# Patient Record
Sex: Female | Born: 1980 | Race: White | Hispanic: No | Marital: Single | Smoking: Current every day smoker
Health system: Southern US, Community
[De-identification: ages and names within clinical notes are randomized; demographics above are authoritative.]

## PROBLEM LIST (undated history)

## (undated) DIAGNOSIS — E079 Disorder of thyroid, unspecified: Secondary | ICD-10-CM

## (undated) DIAGNOSIS — I10 Essential (primary) hypertension: Secondary | ICD-10-CM

## (undated) DIAGNOSIS — G43909 Migraine, unspecified, not intractable, without status migrainosus: Secondary | ICD-10-CM

## (undated) DIAGNOSIS — Z8041 Family history of malignant neoplasm of ovary: Secondary | ICD-10-CM

## (undated) HISTORY — PX: MOUTH SURGERY: SHX715

## (undated) HISTORY — DX: Family history of malignant neoplasm of ovary: Z80.41

## (undated) HISTORY — PX: SHOULDER SURGERY: SHX246

## (undated) HISTORY — PX: SHOULDER ARTHROSCOPY: SHX128

---

## 2005-09-16 ENCOUNTER — Emergency Department: Payer: Self-pay | Admitting: Emergency Medicine

## 2007-05-31 ENCOUNTER — Emergency Department: Payer: Self-pay | Admitting: Emergency Medicine

## 2007-07-14 ENCOUNTER — Emergency Department: Payer: Self-pay | Admitting: Emergency Medicine

## 2007-10-11 ENCOUNTER — Ambulatory Visit: Payer: Self-pay | Admitting: Obstetrics & Gynecology

## 2007-10-16 ENCOUNTER — Ambulatory Visit: Payer: Self-pay | Admitting: Obstetrics & Gynecology

## 2007-11-07 ENCOUNTER — Observation Stay: Payer: Self-pay | Admitting: Obstetrics and Gynecology

## 2007-11-26 ENCOUNTER — Observation Stay: Payer: Self-pay | Admitting: Obstetrics and Gynecology

## 2007-12-09 ENCOUNTER — Inpatient Hospital Stay: Payer: Self-pay | Admitting: Obstetrics & Gynecology

## 2009-01-25 ENCOUNTER — Emergency Department: Payer: Self-pay | Admitting: Emergency Medicine

## 2009-01-28 ENCOUNTER — Emergency Department: Payer: Self-pay | Admitting: Emergency Medicine

## 2009-02-01 ENCOUNTER — Emergency Department: Payer: Self-pay | Admitting: Unknown Physician Specialty

## 2009-02-08 ENCOUNTER — Emergency Department: Payer: Self-pay | Admitting: Emergency Medicine

## 2009-02-15 ENCOUNTER — Emergency Department: Payer: Self-pay | Admitting: Unknown Physician Specialty

## 2010-06-11 ENCOUNTER — Emergency Department: Payer: Self-pay | Admitting: Emergency Medicine

## 2012-03-19 ENCOUNTER — Emergency Department: Payer: Self-pay | Admitting: Emergency Medicine

## 2012-05-03 ENCOUNTER — Emergency Department: Payer: Self-pay | Admitting: Emergency Medicine

## 2012-06-26 ENCOUNTER — Emergency Department: Payer: Self-pay | Admitting: Emergency Medicine

## 2014-07-20 ENCOUNTER — Encounter: Payer: Self-pay | Admitting: *Deleted

## 2014-07-20 ENCOUNTER — Emergency Department
Admission: EM | Admit: 2014-07-20 | Discharge: 2014-07-20 | Disposition: A | Discharge status: Home / Self Care | Payer: Self-pay | Attending: Emergency Medicine | Admitting: Emergency Medicine

## 2014-07-20 DIAGNOSIS — Z720 Tobacco use: Secondary | ICD-10-CM | POA: Insufficient documentation

## 2014-07-20 DIAGNOSIS — L0231 Cutaneous abscess of buttock: Secondary | ICD-10-CM | POA: Insufficient documentation

## 2014-07-20 MED ORDER — HYDROCODONE-ACETAMINOPHEN 5-325 MG PO TABS: 1.0000 | ORAL_TABLET | Freq: Four times a day (QID) | ORAL | Status: DC | PRN

## 2014-07-20 MED ORDER — DOXYCYCLINE HYCLATE 100 MG PO TABS: 100.0000 mg | ORAL_TABLET | Freq: Two times a day (BID) | ORAL | Status: DC

## 2014-07-20 NOTE — ED Provider Notes (Signed)
[facility] Provider Note ?____________________________________________ ? Time seen: 4:08 PM on 07/20/2014 ----------------------------------------- ? I have reviewed the triage vital signs and the nursing notes.  ________ HISTORY ? Chief Complaint Abscess   HPI  Howell Rucksara Simar is a 34 y.o. female with a complaint of tender, red, raised tender lesion to the right buttocks. She reports the boil has been draining today. She first noticed the tender, indurated area 2 days prior. She denies fever, chills, sweats.  History reviewed. No pertinent past medical history.  There are no active problems to display for this patient.  ? Past Surgical History  Procedure Laterality Date  . Cesarean section    . Shoulder surgery    . Mouth surgery    ? Allergies Tramadol ? No family history on file. ? Social History History  Substance Use Topics  . Smoking status: Current Every Day Smoker  . Smokeless tobacco: Not on file  . Alcohol Use: Yes    Review of Systems Constitutional: Negative for fever. HEENT:  Normocephalic/atraumatic. Negative for visual/hearingchanges, sore throat, or nasal congestion. Cardiovascular: Negative for chest pain. Respiratory: Negative for shortness of breath. Musculoskeletal: Negative for back pain. Skin: Positive for cellulitis & abscess Neurological: Negative for headaches, focal weakness or numbness. Hematological/Lymphatic:Negative for enlarged lymph nodes  10-point ROS otherwise negative.  ____________________________________________   PHYSICAL EXAM:  VITAL SIGNS: ED Triage Vitals  Enc Vitals Group     BP 07/20/14 1535 121/75 mmHg     Pulse Rate 07/20/14 1535 97     Resp 07/20/14 1535 18     Temp 07/20/14 1535 98.1 F (36.7 C)     Temp Source 07/20/14 1535 Oral     SpO2 07/20/14 1535 97 %     Weight 07/20/14 1535 217 lb (98.431 kg)     Height 07/20/14 1535 5\' 3"  (1.6 m)     Head Cir --       Peak Flow --      Pain Score 07/20/14 1536 8     Pain Loc --      Pain Edu? --      Excl. in GC? --     Constitutional: Alert and oriented. Well appearing and in no distress. HEENT: Normocephalic and atraumatic.Conjunctivae are normal. PERRL. Normal extraocular movements. Mucous membranes are moist. Hematological/Lymphatic/Immunilogical: No cervical lymphadenopathy. Cardiovascular: Normal rate, regular rhythm.No murmurs, rubs, or gallops.  Respiratory: Normal respiratory effort. Breath sounds are clear and equal bilaterally. No wheezes/rales/rhonchi. Gastrointestinal: Soft and nontender. No distention.  Genitourinary: Local cellulitis with underlying fluctuance noted to right buttock. Musculoskeletal: Nontender with normal range of motion in all extremities. No joint effusions.  No lower extremity tenderness nor edema. Neurologic:  Normal speech and language. No gross focal neurologic deficits are appreciated.  Skin:  Left buttocks with local erythema, induration, and small punctal lesion. A deep palpable cystic lesion is felt and thought to be appropriate for incision and drainage. Psychiatric: Mood and affect are normal. Speech and behavior are normal. Patient exhibits appropriate insight and judgment.  _____________ PROCEDURES ? Procedure(s) performed:  INCISION AND DRAINAGE Performed by: Sherrie SportElon PA-Student Supervised by Paulino DoorJenise Bacon Tristin Gladman, PA-C Consent: Verbal consent obtained. Risks and benefits: risks, benefits and alternatives were discussed Type: abscess  Body area: Right buttock  Anesthesia: local infiltration  Incision was made with a scalpel.  Local anesthetic: lidocaine 1% w/ epinephrine  Anesthetic total: 3 ml  Complexity: complex Blunt dissection to break up loculations  Drainage: purulent  Drainage amount:  small  Packing material: 1/4 in iodoform gauze  Patient tolerance: Patient tolerated the procedure well with no immediate  complications.   Critical Care performed: None  ______________________________________________________ INITIAL IMPRESSION / ASSESSMENT AND PLAN / ED COURSE ? Wound care instructions given. Patient discharged after procedure.  Pertinent labs & imaging results that were available during my care of the patient were reviewed by me and considered in my medical decision making (see chart for details).   ____________________________________________ FINAL CLINICAL IMPRESSION(S) / ED DIAGNOSES?  Final diagnoses:  Abscess of buttock       Lissa HoardJenise V Bacon Tyion Boylen, PA-C 07/20/14 1657  Darien Ramusavid W Kaminski, MD 07/20/14 2229

## 2014-07-20 NOTE — ED Notes (Addendum)
Pt reports she has a boil  on on right lower buttocks that has been draining today.

## 2014-07-20 NOTE — ED Notes (Signed)
NAD noted at time of D/C. Pt denies questions or concerns. Pt ambulatory to the lobby at this time.  

## 2014-07-20 NOTE — Discharge Instructions (Signed)
Abscess An abscess (boil or furuncle) is an infected area on or under the skin. This area is filled with yellowish-white fluid (pus) and other material (debris). HOME CARE   Only take medicines as told by your doctor.  If you were given antibiotic medicine, take it as directed. Finish the medicine even if you start to feel better.  If gauze is used, follow your doctor's directions for changing the gauze.  To avoid spreading the infection:  Keep your abscess covered with a bandage.  Wash your hands well.  Do not share personal care items, towels, or whirlpools with others.  Avoid skin contact with others.  Keep your skin and clothes clean around the abscess.  Keep all doctor visits as told. GET HELP RIGHT AWAY IF:   You have more pain, puffiness (swelling), or redness in the wound site.  You have more fluid or blood coming from the wound site.  You have muscle aches, chills, or you feel sick.  You have a fever. MAKE SURE YOU:   Understand these instructions.  Will watch your condition.  Will get help right away if you are not doing well or get worse. Document Released: 08/20/2007 Document Revised: 09/02/2011 Document Reviewed: 05/16/2011 [facility] Patient Information 2015 [place], [place]. This information is not intended to replace advice given to you by your health care provider. Make sure you discuss any questions you have with your health care provider.   Keep the wound clean, dry, and covered.  Apply warm compresses to promote healing.  Return the ED in 2-3 days for wound check as needed. Take the prescription meds as directed.

## 2014-07-20 NOTE — ED Notes (Signed)
States she developed an abcess to buttocks couple of days ago...large red raised area noted to right buttocks

## 2015-07-31 ENCOUNTER — Emergency Department
Admission: EM | Admit: 2015-07-31 | Discharge: 2015-07-31 | Disposition: A | Discharge status: Home / Self Care | Payer: Medicaid Other | Attending: Emergency Medicine | Admitting: Emergency Medicine

## 2015-07-31 ENCOUNTER — Encounter: Payer: Self-pay | Admitting: Emergency Medicine

## 2015-07-31 ENCOUNTER — Emergency Department: Payer: Medicaid Other

## 2015-07-31 DIAGNOSIS — R0981 Nasal congestion: Secondary | ICD-10-CM

## 2015-07-31 DIAGNOSIS — F17200 Nicotine dependence, unspecified, uncomplicated: Secondary | ICD-10-CM | POA: Diagnosis not present

## 2015-07-31 DIAGNOSIS — J0120 Acute ethmoidal sinusitis, unspecified: Secondary | ICD-10-CM | POA: Insufficient documentation

## 2015-07-31 DIAGNOSIS — J0100 Acute maxillary sinusitis, unspecified: Secondary | ICD-10-CM

## 2015-07-31 DIAGNOSIS — R42 Dizziness and giddiness: Secondary | ICD-10-CM

## 2015-07-31 LAB — URINALYSIS COMPLETE WITH MICROSCOPIC (ARMC ONLY)
Bilirubin Urine: NEGATIVE
Glucose, UA: NEGATIVE mg/dL
KETONES UR: NEGATIVE mg/dL
Leukocytes, UA: NEGATIVE
NITRITE: NEGATIVE
PH: 5 (ref 5.0–8.0)
Protein, ur: 30 mg/dL — AB
SPECIFIC GRAVITY, URINE: 1.015 (ref 1.005–1.030)

## 2015-07-31 LAB — BASIC METABOLIC PANEL
ANION GAP: 7 (ref 5–15)
BUN: 6 mg/dL (ref 6–20)
CALCIUM: 9 mg/dL (ref 8.9–10.3)
CO2: 26 mmol/L (ref 22–32)
CREATININE: 0.53 mg/dL (ref 0.44–1.00)
Chloride: 105 mmol/L (ref 101–111)
GFR calc Af Amer: >60 mL/min (ref >60)
Glucose, Bld: 108 mg/dL — ABNORMAL HIGH (ref 65–99)
Potassium: 3.6 mmol/L (ref 3.5–5.1)
Sodium: 138 mmol/L (ref 135–145)

## 2015-07-31 LAB — CBC
HCT: 38.9 % (ref 35.0–47.0)
HEMOGLOBIN: 13 g/dL (ref 12.0–16.0)
MCH: 28.3 pg (ref 26.0–34.0)
MCHC: 33.5 g/dL (ref 32.0–36.0)
MCV: 84.6 fL (ref 80.0–100.0)
PLATELETS: 208 10*3/uL (ref 150–440)
RBC: 4.6 MIL/uL (ref 3.80–5.20)
RDW: 15.2 % — ABNORMAL HIGH (ref 11.5–14.5)
WBC: 8.2 10*3/uL (ref 3.6–11.0)

## 2015-07-31 LAB — POCT PREGNANCY, URINE: Preg Test, Ur: NEGATIVE

## 2015-07-31 MED ORDER — DOXYCYCLINE HYCLATE 100 MG PO TABS
100.0000 mg | ORAL_TABLET | Freq: Once | ORAL | Status: AC
  Administered 2015-07-31: 100 mg via ORAL
  Filled 2015-07-31: qty 1

## 2015-07-31 MED ORDER — DOXYCYCLINE HYCLATE 50 MG PO CAPS: 100.0000 mg | ORAL_CAPSULE | Freq: Two times a day (BID) | ORAL | Status: DC

## 2015-07-31 NOTE — ED Notes (Signed)
Pt to ed with c/o intermittent dizziness x 2 days.  Also reports congestion in head and headache.  Pt also reports general joint pain and muscle pain and soreness.

## 2015-07-31 NOTE — ED Notes (Signed)
Patient given ginger ale. Awaiting provider re-eval, patient aware. No other needs at this time.

## 2015-07-31 NOTE — ED Notes (Signed)
Pt reports congestion and cough that began a few days ago, pt reports dizziness that began off and on 3 days ago.

## 2015-07-31 NOTE — Discharge Instructions (Signed)
You were evaluated for nasal congestion, headache, and dizziness, and your exam and evaluation are reassuring in the emergency room. You're being treated for sinusitis and vertigo. Make sure he stay well-hydrated. He may try over-the-counter Mucinex, and Sudafed. Return to emergency department for any worsening condition including infusion altered mental status, weakness or numbness, fever, or any other symptoms concerning to you.   Benign Positional Vertigo Vertigo is the feeling that you or your surroundings are moving when they are not. Benign positional vertigo is the most common form of vertigo. The cause of this condition is not serious (is benign). This condition is triggered by certain movements and positions (is positional). This condition can be dangerous if it occurs while you are doing something that could endanger you or others, such as driving.  CAUSES In many cases, the cause of this condition is not known. It may be caused by a disturbance in an area of the inner ear that helps your brain to sense movement and balance. This disturbance can be caused by a viral infection (labyrinthitis), head injury, or repetitive motion. RISK FACTORS This condition is more likely to develop in:  Women.  People who are 58 years of age or older. SYMPTOMS Symptoms of this condition usually happen when you move your head or your eyes in different directions. Symptoms may start suddenly, and they usually last for less than a minute. Symptoms may include:  Loss of balance and falling.  Feeling like you are spinning or moving.  Feeling like your surroundings are spinning or moving.  Nausea and vomiting.  Blurred vision.  Dizziness.  Involuntary eye movement (nystagmus). Symptoms can be mild and cause only slight annoyance, or they can be severe and interfere with daily life. Episodes of benign positional vertigo may return (recur) over time, and they may be triggered by certain movements.  Symptoms may improve over time. DIAGNOSIS This condition is usually diagnosed by medical history and a physical exam of the head, neck, and ears. You may be referred to a health care provider who specializes in ear, nose, and throat (ENT) problems (otolaryngologist) or a provider who specializes in disorders of the nervous system (neurologist). You may have additional testing, including:  MRI.  A CT scan.  Eye movement tests. Your health care provider may ask you to change positions quickly while he or she watches you for symptoms of benign positional vertigo, such as nystagmus. Eye movement may be tested with an electronystagmogram (ENG), caloric stimulation, the Dix-Hallpike test, or the roll test.  An electroencephalogram (EEG). This records electrical activity in your brain.  Hearing tests. TREATMENT Usually, your health care provider will treat this by moving your head in specific positions to adjust your inner ear back to normal. Surgery may be needed in severe cases, but this is rare. In some cases, benign positional vertigo may resolve on its own in 2-4 weeks. HOME CARE INSTRUCTIONS Safety  Move slowly.Avoid sudden body or head movements.  Avoid driving.  Avoid operating heavy machinery.  Avoid doing any tasks that would be dangerous to you or others if a vertigo episode would occur.  If you have trouble walking or keeping your balance, try using a cane for stability. If you feel dizzy or unstable, sit down right away.  Return to your normal activities as told by your health care provider. Ask your health care provider what activities are safe for you. General Instructions  Take over-the-counter and prescription medicines only as told by your health care  provider.  Avoid certain positions or movements as told by your health care provider.  Drink enough fluid to keep your urine clear or pale yellow.  Keep all follow-up visits as told by your health care provider. This is  important. SEEK MEDICAL CARE IF:  You have a fever.  Your condition gets worse or you develop new symptoms.  Your family or friends notice any behavioral changes.  Your nausea or vomiting gets worse.  You have numbness or a "pins and needles" sensation. SEEK IMMEDIATE MEDICAL CARE IF:  You have difficulty speaking or moving.  You are always dizzy.  You faint.  You develop severe headaches.  You have weakness in your legs or arms.  You have changes in your hearing or vision.  You develop a stiff neck.  You develop sensitivity to light.   This information is not intended to replace advice given to you by your health care provider. Make sure you discuss any questions you have with your health care provider.   Document Released: 12/09/2005 Document Revised: 11/22/2014 Document Reviewed: 06/26/2014 Elsevier Interactive Patient Education 2016 [employer]  Sinusitis, Adult Sinusitis is redness, soreness, and inflammation of the paranasal sinuses. Paranasal sinuses are air pockets within the bones of your face. They are located beneath your eyes, in the middle of your forehead, and above your eyes. In healthy paranasal sinuses, mucus is able to drain out, and air is able to circulate through them by way of your nose. However, when your paranasal sinuses are inflamed, mucus and air can become trapped. This can allow bacteria and other germs to grow and cause infection. Sinusitis can develop quickly and last only a short time (acute) or continue over a long period (chronic). Sinusitis that lasts for more than 12 weeks is considered chronic. CAUSES Causes of sinusitis include:  Allergies.  Structural abnormalities, such as displacement of the cartilage that separates your nostrils (deviated septum), which can decrease the air flow through your nose and sinuses and affect sinus drainage.  Functional abnormalities, such as when the small hairs (cilia) that line your sinuses and  help remove mucus do not work properly or are not present. SIGNS AND SYMPTOMS Symptoms of acute and chronic sinusitis are the same. The primary symptoms are pain and pressure around the affected sinuses. Other symptoms include:  Upper toothache.  Earache.  Headache.  Bad breath.  Decreased sense of smell and taste.  A cough, which worsens when you are lying flat.  Fatigue.  Fever.  Thick drainage from your nose, which often is green and may contain pus (purulent).  Swelling and warmth over the affected sinuses. DIAGNOSIS Your health care provider will perform a physical exam. During your exam, your health care provider may perform any of the following to help determine if you have acute sinusitis or chronic sinusitis:  Look in your nose for signs of abnormal growths in your nostrils (nasal polyps).  Tap over the affected sinus to check for signs of infection.  View the inside of your sinuses using an imaging device that has a light attached (endoscope). If your health care provider suspects that you have chronic sinusitis, one or more of the following tests may be recommended:  Allergy tests.  Nasal culture. A sample of mucus is taken from your nose, sent to a lab, and screened for bacteria.  Nasal cytology. A sample of mucus is taken from your nose and examined by your health care provider to determine if your sinusitis is related  to an allergy. TREATMENT Most cases of acute sinusitis are related to a viral infection and will resolve on their own within 10 days. Sometimes, medicines are prescribed to help relieve symptoms of both acute and chronic sinusitis. These may include pain medicines, decongestants, nasal steroid sprays, or saline sprays. However, for sinusitis related to a bacterial infection, your health care provider will prescribe antibiotic medicines. These are medicines that will help kill the bacteria causing the infection. Rarely, sinusitis is caused by a  fungal infection. In these cases, your health care provider will prescribe antifungal medicine. For some cases of chronic sinusitis, surgery is needed. Generally, these are cases in which sinusitis recurs more than 3 times per year, despite other treatments. HOME CARE INSTRUCTIONS  Drink plenty of water. Water helps thin the mucus so your sinuses can drain more easily.  Use a humidifier.  Inhale steam 3-4 times a day (for example, sit in the bathroom with the shower running).  Apply a warm, moist washcloth to your face 3-4 times a day, or as directed by your health care provider.  Use saline nasal sprays to help moisten and clean your sinuses.  Take medicines only as directed by your health care provider.  If you were prescribed either an antibiotic or antifungal medicine, finish it all even if you start to feel better. SEEK IMMEDIATE MEDICAL CARE IF:  You have increasing pain or severe headaches.  You have nausea, vomiting, or drowsiness.  You have swelling around your face.  You have vision problems.  You have a stiff neck.  You have difficulty breathing.   This information is not intended to replace advice given to you by your health care provider. Make sure you discuss any questions you have with your health care provider.   Document Released: 03/03/2005 Document Revised: 03/24/2014 Document Reviewed: 03/18/2011 Elsevier Interactive Patient Education [employer]

## 2015-07-31 NOTE — ED Notes (Signed)
Patient sleeping soundly

## 2015-07-31 NOTE — ED Provider Notes (Addendum)
[facility] Emergency Department Provider Note   ____________________________________________  Time seen: Approximately 12:30 PM I have reviewed the triage vital signs and the triage nursing note.  HISTORY  Chief Complaint Dizziness   Historian Patient  HPI Kendra Clements is a 35 y.o. female who is here for evaluation of dizziness which she describes as room spinning which is worse when she changes positions or lays down. She is also having a dull headache posteriorly for about 24 hours. She states that she has had the dizziness/vertigo symptoms occasionally over the past couple months. She is here for the constellation of headache plus the vertigo which has not been evaluated, as well as sinus congestion and nasal drainage for about 2 days.  She did have nasal congestion for a couple weeks, and it was gone and she thought it was may be viral rather than sinus allergies, but then it came back and she is unsure whether or not this may be viral versus seasonal allergies.  No fever. No palpitations. No chest pain. Breathing. No abdominal pain.    History reviewed. No pertinent past medical history. 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  There are no active problems to display for this patient.   Past Surgical History  Procedure Laterality Date  . Cesarean section    . Shoulder surgery    . Mouth surgery      Current Outpatient Rx  Name  Route  Sig  Dispense  Refill  . doxycycline (VIBRAMYCIN) 50 MG capsule   Oral   Take 2 capsules (100 mg total) by mouth 2 (two) times daily.   40 capsule   0   . HYDROcodone-acetaminophen (NORCO) 5-325 MG per tablet   Oral   Take 1 tablet by mouth every 6 (six) hours as needed for moderate pain.   6 tablet   0     Allergies Tramadol  History reviewed. No pertinent family history.  Social History Social History  Substance Use  Topics  . Smoking status: Current Every Day Smoker  . Smokeless tobacco: None  . Alcohol Use: Yes    Review of Systems  Constitutional: Negative for fever. Eyes: Negative for visual changes. ENT: Negative for sore throat. Cardiovascular: Negative for chest pain. Respiratory: Negative for shortness of breath. Gastrointestinal: Negative for abdominal pain, vomiting and diarrhea. Genitourinary: Negative for dysuria. Musculoskeletal: Negative for back pain. Skin: Small itchy rash left abdomen which is been there for months and she's been using an antifungal. She also has 2 small spots on her left shin which she's also been treated with antifungal. Neurological: Positive for headache. 10 point Review of Systems otherwise negative ____________________________________________   PHYSICAL EXAM:  VITAL SIGNS: ED Triage Vitals  Enc Vitals Group     BP 07/31/15 1048 141/81 mmHg     Pulse Rate 07/31/15 1048 109     Resp 07/31/15 1048 20     Temp 07/31/15 1048 98.2 F (36.8 C)     Temp Source 07/31/15 1048 Oral     SpO2 07/31/15 1048 100 %     Weight 07/31/15 1048 230 lb (104.327 kg)     Height 07/31/15 1048  (1.6 m)     Head Cir --      Peak Flow --      Pain Score 07/31/15 1049 6     Pain Loc --  Pain Edu? --      Excl. in GC? --      Constitutional: Alert and oriented. Well appearing and in no distress. HEENT   Head: Normocephalic and atraumatic.      Eyes: Conjunctivae are normal. PERRL. Normal extraocular movements.      Ears:         Nose: Nasal congestion without rhinorrhea.   Mouth/Throat: Mucous membranes are moist.   Neck: No stridor. Cardiovascular/Chest: Normal rate, regular rhythm.  No murmurs, rubs, or gallops. Respiratory: Normal respiratory effort without tachypnea nor retractions. Breath sounds are clear and equal bilaterally. No wheezes/rales/rhonchi. Gastrointestinal: Soft. No distention, no guarding, no rebound. Nontender.     Genitourinary/rectal:Deferred Musculoskeletal: Nontender with normal range of motion in all extremities. No joint effusions.  No lower extremity tenderness.  No edema. Neurologic:  Normal speech and language. No gross or focal neurologic deficits are appreciated. Skin:  Skin is warm, dry and intact. No rash noted. Psychiatric: Mood and affect are normal. Speech and behavior are normal. Patient exhibits appropriate insight and judgment.  ____________________________________________   EKG I, Governor Rooksebecca Leiam Hopwood, MD, the attending physician have personally viewed and interpreted all ECGs.  100 bpm. normal sinus rhythm. normal axis. Normal ST and T-wave ____________________________________________  LABS (pertinent positives/negatives)  Urine pregnancy test negative Basic metabolic panel without significant abnormality CBC within normal limits Urinalysis rare bacteria otherwise negative  ____________________________________________  RADIOLOGY All Xrays were viewed by me. Imaging interpreted by Radiologist.  CT without contrast: No acute intracranial pathology. Sinus disease in the maxillary and ethmoid sinuses. __________________________________________  PROCEDURES  Procedure(s) performed: None  Critical Care performed: None  ____________________________________________   ED COURSE / ASSESSMENT AND PLAN  Pertinent labs & imaging results that were available during my care of the patient were reviewed by me and considered in my medical decision making (see chart for details).   This patient is here for constellation of symptoms, but it sounds like she is most concerned about the headache associated with the sinus congestion associated with the dizziness which is described as room spinning/vertigo. On the one hand that seems peripheral vertigo, however she gives a history of prior brain aneurysm and she is unclear whether or not she had any intervention and she doesn't typically have  a headache in the posterior part of her head and so I discussed with her risks versus benefit of obtaining head CT, and will proceed.   CT of the head shows no intracranial abnormalities, but positive for sinusitis which is pretty consistent with her congestion and vertigo symptoms. I will treat and discharge with doxycycline.    CONSULTATIONS:   None   Patient / Family / Caregiver informed of clinical course, medical decision-making process, and agree with plan.   I discussed return precautions, follow-up instructions, and discharged instructions with patient and/or family.   ___________________________________________   FINAL CLINICAL IMPRESSION(S) / ED DIAGNOSES   Final diagnoses:  Vertigo  Nasal sinus congestion  Acute maxillary sinusitis, recurrence not specified  Acute ethmoidal sinusitis, recurrence not specified              Note: This dictation was prepared with Dragon dictation. Any transcriptional errors that result from this process are unintentional   Governor Rooksebecca Dene Landsberg, MD 07/31/15 [id number]  Governor Rooksebecca Ankith Ed[place]ston, MD 07/31/15 [phone]

## 2016-03-23 ENCOUNTER — Encounter (HOSPITAL_COMMUNITY): Payer: Self-pay

## 2016-03-23 ENCOUNTER — Emergency Department (HOSPITAL_COMMUNITY)
Admission: EM | Admit: 2016-03-23 | Discharge: 2016-03-24 | Disposition: A | Discharge status: Home / Self Care | Payer: Medicaid Other | Attending: Emergency Medicine | Admitting: Emergency Medicine

## 2016-03-23 ENCOUNTER — Ambulatory Visit (HOSPITAL_COMMUNITY)
Admission: RE | Admit: 2016-03-23 | Discharge: 2016-03-23 | Disposition: A | Discharge status: Home / Self Care | Payer: Medicaid Other | Source: Ambulatory Visit | Attending: Emergency Medicine | Admitting: Emergency Medicine

## 2016-03-23 DIAGNOSIS — Z5321 Procedure and treatment not carried out due to patient leaving prior to being seen by health care provider: Secondary | ICD-10-CM | POA: Insufficient documentation

## 2016-03-23 DIAGNOSIS — R079 Chest pain, unspecified: Secondary | ICD-10-CM | POA: Insufficient documentation

## 2016-03-23 DIAGNOSIS — R0789 Other chest pain: Secondary | ICD-10-CM | POA: Diagnosis present

## 2016-03-23 DIAGNOSIS — Z79899 Other long term (current) drug therapy: Secondary | ICD-10-CM | POA: Diagnosis not present

## 2016-03-23 DIAGNOSIS — F17200 Nicotine dependence, unspecified, uncomplicated: Secondary | ICD-10-CM

## 2016-03-23 LAB — CBC
HEMATOCRIT: 39.4 % (ref 36.0–46.0)
HEMOGLOBIN: 12.9 g/dL (ref 12.0–15.0)
MCH: 27.3 pg (ref 26.0–34.0)
MCHC: 32.7 g/dL (ref 30.0–36.0)
MCV: 83.5 fL (ref 78.0–100.0)
Platelets: 256 10*3/uL (ref 150–400)
RBC: 4.72 MIL/uL (ref 3.87–5.11)
RDW: 12.9 % (ref 11.5–15.5)
WBC: 8.2 10*3/uL (ref 4.0–10.5)

## 2016-03-23 LAB — BASIC METABOLIC PANEL
ANION GAP: 12 (ref 5–15)
BUN: 12 mg/dL (ref 6–20)
CHLORIDE: 103 mmol/L (ref 101–111)
CO2: 24 mmol/L (ref 22–32)
Calcium: 9.7 mg/dL (ref 8.9–10.3)
Creatinine, Ser: 0.39 mg/dL — ABNORMAL LOW (ref 0.44–1.00)
GFR calc Af Amer: >60 mL/min (ref >60)
GLUCOSE: 121 mg/dL — AB (ref 65–99)
POTASSIUM: 3.6 mmol/L (ref 3.5–5.1)
Sodium: 139 mmol/L (ref 135–145)

## 2016-03-23 LAB — I-STAT TROPONIN, ED: Troponin i, poc: 0.01 ng/mL (ref 0.00–0.08)

## 2016-03-23 LAB — D-DIMER, QUANTITATIVE: D-Dimer, Quant: 0.35 ug/mL-FEU (ref 0.00–0.50)

## 2016-03-23 MED ORDER — KETOROLAC TROMETHAMINE 15 MG/ML IJ SOLN
15.0000 mg | Freq: Once | INTRAMUSCULAR | Status: AC
  Administered 2016-03-24: 15 mg via INTRAMUSCULAR
  Filled 2016-03-23: qty 1

## 2016-03-23 MED ORDER — HYDROCODONE-ACETAMINOPHEN 5-325 MG PO TABS
1.0000 | ORAL_TABLET | Freq: Once | ORAL | Status: AC
  Administered 2016-03-24: 1 via ORAL
  Filled 2016-03-23: qty 1

## 2016-03-23 NOTE — ED Provider Notes (Signed)
[facility]-EMERGENCY DEPT Provider Note   CSN: [id number] Arrival date & time: 03/23/16  2051  By signiny name below, I, Majel HomerPeyton Lee, attest that this documentation has been prepared under the direction and in the presence of Shon Batonourtney F Horton, MD . Electronically Signed: Majel HomerPeyton Lee, Scribe. 03/23/2016. 11:51 PM.  History   Chief Complaint Chief Complaint  Patient presents with  . Chest Pain   The history is provided by the patient. No language interpreter was used.   HPI Comments: Kendra Clements is a 36 y.o. female who presents to the Emergency Department complaining of constant, 8/10, chest pain that began 2 days ago. Pt reports her pain radiates into her back with associated shortness of breath secondary to her chest pain, intermittent chest tightness, sensation of palpations, and constipation. She notes this is the first time she has experienced similar symptoms. She states she has taken Tylenol for her pain with no relief. Pt denies cardiac hx, leg swelling, recent long or international travel, nausea, vomiting, diarrhea, and fever. She reports she smokes cigarettes every day and is currently taking contraceptive medication and phentermine for weight loss.   History reviewed. No pertinent past medical history.  There are no active problems to display for this patient.   Past Surgical History:  Procedure Laterality Date  . CESAREAN SECTION    . MOUTH SURGERY    . SHOULDER SURGERY      OB History    Gravida Para Term Preterm AB Living   1         1   SAB TAB Ectopic Multiple Live Births                   Home Medications    Prior to Admission medications   Medication Sig Start Date End Date Taking? Authorizing Provider  doxycycline (VIBRAMYCIN) 50 MG capsule Take 2 capsules (100 mg total) by mouth 2 (two) times daily. 07/31/15   Governor Rooksebecca Lord, MD  HYDROcodone-acetaminophen (NORCO) 5-325 MG per tablet Take 1 tablet by mouth every 6 (six) hours as needed for moderate pain. 07/20/14    Jenise V Bacon Menshew, PA-C  ibuprofen (ADVIL,MOTRIN) 600 MG tablet Take 1 tablet (600 mg total) by mouth every 6 (six) hours as needed. 03/24/16   Shon Batonourtney F Horton, MD    Family History No family history on file.  Social History Social History  Substance Use Topics  . Smoking status: Current Every Day Smoker  . Smokeless tobacco: Not on file  . Alcohol use Yes     Allergies   Tramadol   Review of Systems Review of Systems  Constitutional: Negative for fever.  Respiratory: Positive for chest tightness and shortness of breath.   Cardiovascular: Positive for chest pain and palpitations. Negative for leg swelling.  Gastrointestinal: Positive for constipation. Negative for diarrhea, nausea and vomiting.  Musculoskeletal: Positive for back pain.  All other systems reviewed and are negative.  Physical Exam Updated Vital Signs BP 122/74 (BP Location: Left Arm)   Pulse 102   Temp 97.7 F (36.5 C) (Oral)   Resp 18   LMP 03/03/2016   SpO2 96%   Physical Exam  Constitutional: She is oriented to person, place, and time. She appears well-developed and well-nourished.  Overweight  HENT:  Head: Normocephalic and atraumatic.  Cardiovascular: Normal rate, regular rhythm and normal heart sounds.   No murmur heard. Pulmonary/Chest: Effort normal. No respiratory distress. She has no wheezes. She exhibits no tenderness.  Abdominal: Soft. Bowel sounds are  normal. There is no tenderness. There is no guarding.  Musculoskeletal:  No asymmetric lower extremity swelling  Neurological: She is alert and oriented to person, place, and time.  Skin: Skin is warm and dry.  Psychiatric: She has a normal mood and affect.  Nursing note and vitals reviewed.    ED Treatments / Results  Labs (all labs ordered are listed, but only abnormal results are displayed) Labs Reviewed  BASIC METABOLIC PANEL - Abnormal; Notable for the following:       Result Value   Glucose, Bld 121 (*)    Creatinine,  Ser 0.39 (*)    All other components within normal limits  CBC  D-DIMER, QUANTITATIVE (NOT AT [facility])  I-STAT TROPOININ, ED    EKG  EKG Interpretation  Date/Time:  Sunday March 23 2016 21:00:47 EST Ventricular Rate:  102 PR Interval:  132 QRS Duration: 84 QT Interval:  336 QTC Calculation: 437 R Axis:   80 Text Interpretation:  Sinus tachycardia Otherwise normal ECG Confirmed by HORTON  MD, Toni Amend ([id number]) on 03/23/2016 11:06:27 PM       Radiology No results found.  Procedures Procedures (including critical care time)  Medications Ordered in ED Medications  ketorolac (TORADOL) 15 MG/ML injection 15 mg (15 mg Intramuscular Given 03/24/16 0046)  HYDROcodone-acetaminophen (NORCO/VICODIN) 5-325 MG per tablet 1 tablet (1 tablet Oral Given 03/24/16 0046)    DIAGNOSTIC STUDIES:  Oxygen Saturation is 96% on RA, normal by my interpretation.    COORDINATION OF CARE:  11:26 PM Discussed treatment plan with pt at bedside and pt agreed to plan.  Initial Impression / Assessment and Plan / ED Course  I have reviewed the triage vital signs and the nursing notes.  Pertinent labs & imaging results that were available during my care of the patient were reviewed by me and considered in my medical decision making (see chart for details).  Clinical Course     Patient presents with chest pain. Ongoing for the last day. She is nontoxic. Vital signs notable for tachycardia. She does take phentermine. She is also on oral contraceptives. EKG shows sinus tachycardia. Troponin negative. D-dimer added. Patient given Toradol and Norco for pain. D-dimer is negative. She denies any infectious symptoms and is otherwise well-appearing. Doubt pericarditis or myocarditis. We'll have patient follow-up with cardiology. Discontinue phentermine.  After history, exam, and medical workup I feel the patient has been appropriately medically screened and is safe for discharge home. Pertinent diagnoses were  discussed with the patient. Patient was given return precautions.  I personally performed the services described in this documentation, which was scribed in my presence. The recorded information has been reviewed and is accurate.   Final Clinical Impressions(s) / ED Diagnoses   Final diagnoses:  Atypical chest pain    New Prescriptions New Prescriptions   IBUPROFEN (ADVIL,MOTRIN) 600 MG TABLET    Take 1 tablet (600 mg total) by mouth every 6 (six) hours as needed.     Shon Baton, MD 03/24/16 [phone]

## 2016-03-23 NOTE — ED Notes (Signed)
Called lab to add on D dimer  

## 2016-03-23 NOTE — ED Triage Notes (Signed)
Pt started feeling like her heart was beating fast last night and having CP, pain radiates to back with SOB, denies n/v, some dizziness.

## 2016-03-23 NOTE — ED Notes (Signed)
Pt friend pushes pt to this tech and ask for pt to be reevaluated. Pt vitals updated. Pt taken back to triage so she can be reevaluated. Pt taken to triage 1 and RN Shanda BumpsJessica informed

## 2016-03-23 NOTE — ED Triage Notes (Signed)
Pt states that since yesterday she has felt like her heart has been tight and beating fast, CP is central and Left sided, radiation to L side and back, some dizziness, SOB, denies n/v.

## 2016-03-24 ENCOUNTER — Encounter (HOSPITAL_COMMUNITY): Payer: Self-pay

## 2016-03-24 ENCOUNTER — Emergency Department (HOSPITAL_COMMUNITY)
Admission: EM | Admit: 2016-03-24 | Discharge: 2016-03-24 | Disposition: A | Discharge status: Home / Self Care | Payer: Medicaid Other | Source: Home / Self Care

## 2016-03-24 HISTORY — DX: Migraine, unspecified, not intractable, without status migrainosus: G43.909

## 2016-03-24 MED ORDER — IBUPROFEN 600 MG PO TABS: 600.0000 mg | ORAL_TABLET | Freq: Four times a day (QID) | ORAL | 0 refills | Status: DC | PRN

## 2016-03-24 NOTE — Discharge Instructions (Signed)
You were seen today for chest pain. Your workup is reassuring. Follow-up with cardiology for evaluation. Stop phentermine. If he has new or worsening symptoms you need to be reevaluated.

## 2016-03-26 ENCOUNTER — Emergency Department: Payer: Medicaid Other

## 2016-03-26 ENCOUNTER — Emergency Department
Admission: EM | Admit: 2016-03-26 | Discharge: 2016-03-26 | Disposition: A | Discharge status: Home / Self Care | Payer: Medicaid Other | Attending: Emergency Medicine | Admitting: Emergency Medicine

## 2016-03-26 ENCOUNTER — Encounter: Payer: Self-pay | Admitting: Emergency Medicine

## 2016-03-26 DIAGNOSIS — Z5321 Procedure and treatment not carried out due to patient leaving prior to being seen by health care provider: Secondary | ICD-10-CM | POA: Insufficient documentation

## 2016-03-26 DIAGNOSIS — R079 Chest pain, unspecified: Secondary | ICD-10-CM | POA: Diagnosis not present

## 2016-03-26 DIAGNOSIS — F17200 Nicotine dependence, unspecified, uncomplicated: Secondary | ICD-10-CM | POA: Diagnosis not present

## 2016-03-26 LAB — CBC
HEMATOCRIT: 39.2 % (ref 35.0–47.0)
HEMOGLOBIN: 13.2 g/dL (ref 12.0–16.0)
MCH: 27.6 pg (ref 26.0–34.0)
MCHC: 33.6 g/dL (ref 32.0–36.0)
MCV: 82.1 fL (ref 80.0–100.0)
Platelets: 241 10*3/uL (ref 150–440)
RBC: 4.77 MIL/uL (ref 3.80–5.20)
RDW: 13.6 % (ref 11.5–14.5)
WBC: 7.6 10*3/uL (ref 3.6–11.0)

## 2016-03-26 LAB — BASIC METABOLIC PANEL
ANION GAP: 8 (ref 5–15)
BUN: 11 mg/dL (ref 6–20)
CHLORIDE: 105 mmol/L (ref 101–111)
CO2: 27 mmol/L (ref 22–32)
Calcium: 9.5 mg/dL (ref 8.9–10.3)
Creatinine, Ser: 0.49 mg/dL (ref 0.44–1.00)
GFR calc Af Amer: >60 mL/min (ref >60)
GLUCOSE: 163 mg/dL — AB (ref 65–99)
POTASSIUM: 3.8 mmol/L (ref 3.5–5.1)
Sodium: 140 mmol/L (ref 135–145)

## 2016-03-26 LAB — TROPONIN I: Troponin I: <0.03 ng/mL

## 2016-03-26 NOTE — ED Notes (Signed)
Pt called X 4 times in lobby. No answer.

## 2016-03-26 NOTE — ED Notes (Signed)
Pt called again, no answer. 

## 2016-03-26 NOTE — ED Triage Notes (Signed)
Pt to ed with c/o chest pain that started last week.  Pt states pain is intermittent and associated with activity.  Pt reports sob associated with pain.  [facility]. Reports feels like heart is beating too fast at times.

## 2016-10-06 ENCOUNTER — Encounter: Payer: Self-pay | Admitting: Emergency Medicine

## 2016-10-06 ENCOUNTER — Emergency Department
Admission: EM | Admit: 2016-10-06 | Discharge: 2016-10-06 | Disposition: A | Discharge status: Home / Self Care | Payer: Medicaid Other | Attending: Emergency Medicine | Admitting: Emergency Medicine

## 2016-10-06 DIAGNOSIS — K120 Recurrent oral aphthae: Secondary | ICD-10-CM | POA: Insufficient documentation

## 2016-10-06 DIAGNOSIS — F17200 Nicotine dependence, unspecified, uncomplicated: Secondary | ICD-10-CM | POA: Insufficient documentation

## 2016-10-06 MED ORDER — FIRST-DUKES MOUTHWASH MT SUSP: 5.0000 mL | Freq: Three times a day (TID) | OROMUCOSAL | 0 refills | Status: DC

## 2016-10-06 NOTE — ED Provider Notes (Signed)
[facility] Emergency Department Provider Note  ____________________________________________   None    (approximate)  I have reviewed the triage vital signs and the nursing notes.   HISTORY  Chief Complaint Sore Throat and Blisters in mouth    HPI Kendra Clements is a 36 y.o. female is here with complaint of  Blisters on the inside of her mouth that began yesterday. Patient denies any known fever or chills. She states that the areas inside her mouth are very painful. She denies any other symptoms. She states that her supervisor sent her here for a diagnosis as she works at a [employer] facility. She reports that at least 2 coworkers have hand-foot-and-mouth disease.she rates her pain is 7 out of 10.   Past Medical History:  Diagnosis Date  . Migraines     There are no active problems to display for this patient.   Past Surgical History:  Procedure Laterality Date  . CESAREAN SECTION    . MOUTH SURGERY    . SHOULDER ARTHROSCOPY    . SHOULDER SURGERY      Prior to Admission medications   Medication Sig Start Date End Date Taking? Authorizing Provider  Diphenhyd-Hydrocort-Nystatin (FIRST-DUKES MOUTHWASH) SUSP Use as directed 5 mLs in the mouth or throat 4 (four) times daily -  before meals and at bedtime. 10/06/16   Tommi RumpsSummers, Wynonia Medero L, PA-C    Allergies Tramadol and Tramadol  No family history on file.  Social History Social History  Substance Use Topics  . Smoking status: Current Every Day Smoker  . Smokeless tobacco: Never Used  . Alcohol use Yes    Review of Systems Constitutional: No fever/chills ENT: positive for blisters buccal mucosa. Cardiovascular: Denies chest pain. Respiratory: Denies shortness of breath. Gastrointestinal: No abdominal pain.  No nausea, no vomiting.  No diarrhea.  Skin: denies rash to hands or feet. Neurological: Negative for  headaches   ____________________________________________   PHYSICAL EXAM:  VITAL SIGNS: ED Triage Vitals  Enc Vitals Group     BP 10/06/16 1620 128/66     Pulse Rate 10/06/16 1620 (!) 104     Resp 10/06/16 1620 18     Temp 10/06/16 1620 98.5 F (36.9 C)     Temp src --      SpO2 10/06/16 1620 98 %     Weight 10/06/16 1621 187 lb (84.8 kg)     Height 10/06/16 1621 5\' 3"  (1.6 m)     Head Circumference --      Peak Flow --      Pain Score 10/06/16 1624 7     Pain Loc --      Pain Edu? --      Excl. in GC? --    Constitutional: Alert and oriented. Well appearing and in no acute distress. Eyes: Conjunctivae are normal. PERRL. EOMI. Head: Atraumatic. Nose: No congestion/rhinnorhea. Mouth/Throat: Mucous membranes are moist.  Oropharynx non-erythematous. There are irregular ulcerative lesions on the buccal mucosa posteriorly on the right and left. There is also another ulcerative lesion on the anterior portion of the buccal mucosa lower lip.  Neck: No stridor.   Hematological/Lymphatic/Immunilogical: No cervical lymphadenopathy. Cardiovascular: Normal rate, regular rhythm. Grossly normal heart sounds.  Good peripheral circulation. Respiratory: Normal respiratory effort.  No retractions. Lungs CTAB. Musculoskeletal: his upper and lower extremities without any difficulty. Neurologic:  Normal speech and language. No gross focal neurologic deficits are appreciated.  Skin:  Skin is warm, dry and intact. No other  rash was noted. Psychiatric: Mood and affect are normal. Speech and behavior are normal.  ____________________________________________   LABS (all labs ordered are listed, but only abnormal results are displayed)  Labs Reviewed - No data to display   PROCEDURES  Procedure(s) performed: None  Procedures  Critical Care performed: No  ____________________________________________   INITIAL IMPRESSION / ASSESSMENT AND PLAN / ED COURSE  Pertinent labs & imaging  results that were available during my care of the patient were reviewed by me and considered in my medical decision making (see chart for details).  Patient is given prescription for Dukes mouthwash 1 teaspoon before meals and at bedtime. She is also advised to avoid salty, spicy or foods that are difficult to eat. She may also take Tylenol as needed for pain. She was given a note to remain out of work until her next shift all Saturday. patient is aware that this will clear without other medication.   ___________________________________________   FINAL CLINICAL IMPRESSION(S) / ED DIAGNOSES  Final diagnoses:  Aphthous stomatitis      NEW MEDICATIONS STARTED DURING THIS VISIT:  Discharge Medication List as of 10/06/2016  5:30 PM    START taking these medications   Details  Diphenhyd-Hydrocort-Nystatin (FIRST-DUKES MOUTHWASH) SUSP Use as directed 5 mLs in the mouth or throat 4 (four) times daily -  before meals and at bedtime., Starting Mon 10/06/2016, Print         Note:  This document was prepared using Dragon voice recognition software and may include unintentional dictation errors.    Tommi Rumps, PA-C 10/06/16 1741    Nita Sickle, MD 10/06/16 2029

## 2016-10-06 NOTE — Discharge Instructions (Signed)
Clear liquids and soft food. Avoid any spicy food, salty food, and chips.  Try yogurt, ice cream, plain rice, baked potato with no added spices. Magic mouthwash for the solution that you bought over-the-counter before meals and at bedtime. You may also take Tylenol as needed for pain.

## 2016-10-06 NOTE — ED Triage Notes (Signed)
Pt reports sore throat and blisters in mouth that began yesterday. Pt reports nausea but denies vomiting or diarrhea. Pt reports mild headache.

## 2016-10-06 NOTE — ED Notes (Signed)
See triage note  States she noticed a small blister area to side of mouth yesterday  Today noticed more blisters in mouth and voice is hoarse

## 2017-01-15 ENCOUNTER — Emergency Department
Admission: EM | Admit: 2017-01-15 | Discharge: 2017-01-15 | Disposition: A | Discharge status: Home / Self Care | Payer: Self-pay | Attending: Emergency Medicine | Admitting: Emergency Medicine

## 2017-01-15 DIAGNOSIS — F17200 Nicotine dependence, unspecified, uncomplicated: Secondary | ICD-10-CM | POA: Insufficient documentation

## 2017-01-15 DIAGNOSIS — J020 Streptococcal pharyngitis: Secondary | ICD-10-CM | POA: Insufficient documentation

## 2017-01-15 DIAGNOSIS — R509 Fever, unspecified: Secondary | ICD-10-CM | POA: Insufficient documentation

## 2017-01-15 HISTORY — DX: Disorder of thyroid, unspecified: E07.9

## 2017-01-15 LAB — POCT RAPID STREP A
Streptococcus, Group A Screen (Direct): NEGATIVE
Streptococcus, Group A Screen (Direct): NEGATIVE

## 2017-01-15 MED ORDER — AMOXICILLIN 500 MG PO TABS: 500.0000 mg | ORAL_TABLET | Freq: Two times a day (BID) | ORAL | 0 refills | Status: AC

## 2017-01-15 MED ORDER — AMOXICILLIN 500 MG PO CAPS
500.0000 mg | ORAL_CAPSULE | Freq: Three times a day (TID) | ORAL | Status: DC
  Administered 2017-01-15: 500 mg via ORAL
  Filled 2017-01-15: qty 1

## 2017-01-15 NOTE — ED Triage Notes (Signed)
Patient c/o sore throat beginning Tuesday. Patient reports feeling feverish at home. Patient took 1000 mg tylenol approx 3 hours ago.

## 2017-01-15 NOTE — ED Provider Notes (Signed)
[facility] Emergency Department Provider Note  ____________________________________________  Time seen: Approximately 11:10 PM  I have reviewed the triage vital signs and the nursing notes.   HISTORY  Chief Complaint Sore Throat    HPI Kendra Clements is a 36 y.o. female presents to the emergency department with fever, pharyngitis, anterior neck discomfort, headache and abdominal discomfort for the past 2 days.  Patient reports a history of streptococcal pharyngitis.  Patient states that she acquires strep throat very commonly and this feels like prior episodes of strep throat.  She is tolerating fluids and food by mouth with no major changes in stooling or urinary habits.  She is speaking in complete sentences and managing her own secretions. No alleviating measures have been attempted.    Past Medical History:  Diagnosis Date  . Migraines   . Thyroid disease     There are no active problems to display for this patient.   Past Surgical History:  Procedure Laterality Date  . CESAREAN SECTION    . MOUTH SURGERY    . SHOULDER ARTHROSCOPY    . SHOULDER SURGERY      Prior to Admission medications   Medication Sig Start Date End Date Taking? Authorizing Provider  amoxicillin (AMOXIL) 500 MG tablet Take 1 tablet (500 mg total) by mouth 2 (two) times daily. 01/15/17 01/25/17  Orvil Feil, PA-C  Diphenhyd-Hydrocort-Nystatin (FIRST-DUKES MOUTHWASH) SUSP Use as directed 5 mLs in the mouth or throat 4 (four) times daily -  before meals and at bedtime. 10/06/16   Tommi Rumps, PA-C    Allergies Tramadol and Tramadol  No family history on file.  Social History Social History  Substance Use Topics  . Smoking status: Current Every Day Smoker  . Smokeless tobacco: Never Used  . Alcohol use Yes     Review of Systems  Constitutional: Patient has fever.  Eyes: No visual changes. No discharge ENT: Patient has pharyngitis  Cardiovascular: no  chest pain. Respiratory: no cough. No SOB. Gastrointestinal: No abdominal pain. Patient has had abdominal discomfort.  Musculoskeletal: Negative for musculoskeletal pain. Skin: Negative for rash, abrasions, lacerations, ecchymosis. Neurological: Patient has headache, no focal weakness or numbness.   ____________________________________________   PHYSICAL EXAM:  VITAL SIGNS: ED Triage Vitals [01/15/17 2120]  Enc Vitals Group     BP 135/71     Pulse Rate (!) 108     Resp 19     Temp 98.6 F (37 C)     Temp Source Oral     SpO2 97 %     Weight 203 lb (92.1 kg)     Height 5\' 3"  (1.6 m)     Head Circumference      Peak Flow      Pain Score 7     Pain Loc      Pain Edu?      Excl. in GC?      Constitutional: Alert and oriented. Well appearing and in no acute distress. Eyes: Conjunctivae are normal. PERRL. EOMI. Head: Atraumatic. ENT:      Ears: TMs are pearly bilaterally.       Nose: No congestion/rhinnorhea.      Mouth/Throat: Mucous membranes are moist.  Posterior pharynx is erythematous with bilateral tonsillar hypertrophy and exudate.  Uvula is midline. Neck: No stridor.  No cervical spine tenderness to palpation. Hematological/Lymphatic/Immunilogical: Palpable cervical lymphadenopathy.  Cardiovascular: Normal rate, regular rhythm. Normal S1 and S2.  Good peripheral circulation. Respiratory: Normal respiratory effort  without tachypnea or retractions. Lungs CTAB. Good air entry to the bases with no decreased or absent breath sounds. Gastrointestinal: Bowel sounds 4 quadrants. Soft and nontender to palpation. No guarding or rigidity. No palpable masses. No distention. No CVA tenderness. Skin:  Skin is warm, dry and intact. No rash noted. Psychiatric: Mood and affect are normal. Speech and behavior are normal. Patient exhibits appropriate insight and judgement.   ____________________________________________   LABS (all labs ordered are listed, but only abnormal  results are displayed)  Labs Reviewed  POCT RAPID STREP A  POCT RAPID STREP A   ____________________________________________  EKG   ____________________________________________  RADIOLOGY  No results found.  ____________________________________________    PROCEDURES  Procedure(s) performed:    Procedures    Medications  amoxicillin (AMOXIL) capsule 500 mg (not administered)     ____________________________________________   INITIAL IMPRESSION / ASSESSMENT AND PLAN / ED COURSE  Pertinent labs & imaging results that were available during my care of the patient were reviewed by me and considered in my medical decision making (see chart for details).  Review of the [place] CSRS was performed in accordance of the [facility] prior to dispensing any controlled drugs.    Assessment and plan Strep pharyngitis Patient presents to the emergency department with fever, pharyngitis, tender cervical lymphadenopathy and absence of cough for the past 2 days.  History and physical exam findings are consistent with strep pharyngitis.  Patient was given amoxicillin in the emergency department tonight.  She was discharged with amoxicillin.  All patient questions were answered.  A work note was provided at discharge.  ____________________________________________  FINAL CLINICAL IMPRESSION(S) / ED DIAGNOSES  Final diagnoses:  Strep throat      NEW MEDICATIONS STARTED DURING THIS VISIT:  New Prescriptions   AMOXICILLIN (AMOXIL) 500 MG TABLET    Take 1 tablet (500 mg total) by mouth 2 (two) times daily.        This chart was dictated using voice recognition software/Dragon. Despite best efforts to proofread, errors can occur which can change the meaning. Any change was purely unintentional.    Orvil FeilWoods, Mishika Flippen M, PA-C 01/15/17 2319    Dionne BucySiadecki, Sebastian, MD 01/15/17 [phone]

## 2017-01-15 NOTE — ED Notes (Signed)
Pt states sx since Tuesday, sore throat, white coated, achy joints, and cough. Pt states taking tylenol for fevers

## 2017-02-20 ENCOUNTER — Emergency Department
Admission: EM | Admit: 2017-02-20 | Discharge: 2017-02-20 | Disposition: A | Discharge status: Home / Self Care | Payer: No Typology Code available for payment source | Attending: Emergency Medicine | Admitting: Emergency Medicine

## 2017-02-20 ENCOUNTER — Encounter: Payer: Self-pay | Admitting: Emergency Medicine

## 2017-02-20 ENCOUNTER — Other Ambulatory Visit: Payer: Self-pay

## 2017-02-20 DIAGNOSIS — Z79899 Other long term (current) drug therapy: Secondary | ICD-10-CM | POA: Insufficient documentation

## 2017-02-20 DIAGNOSIS — M542 Cervicalgia: Secondary | ICD-10-CM | POA: Insufficient documentation

## 2017-02-20 DIAGNOSIS — M7918 Myalgia, other site: Secondary | ICD-10-CM

## 2017-02-20 DIAGNOSIS — Y92410 Unspecified street and highway as the place of occurrence of the external cause: Secondary | ICD-10-CM | POA: Diagnosis not present

## 2017-02-20 DIAGNOSIS — Y999 Unspecified external cause status: Secondary | ICD-10-CM | POA: Diagnosis not present

## 2017-02-20 DIAGNOSIS — F17200 Nicotine dependence, unspecified, uncomplicated: Secondary | ICD-10-CM | POA: Diagnosis not present

## 2017-02-20 DIAGNOSIS — M5489 Other dorsalgia: Secondary | ICD-10-CM | POA: Insufficient documentation

## 2017-02-20 DIAGNOSIS — Y9389 Activity, other specified: Secondary | ICD-10-CM | POA: Diagnosis not present

## 2017-02-20 MED ORDER — IBUPROFEN 600 MG PO TABS: 600.0000 mg | ORAL_TABLET | Freq: Four times a day (QID) | ORAL | 0 refills | Status: DC | PRN

## 2017-02-20 MED ORDER — CYCLOBENZAPRINE HCL 5 MG PO TABS: ORAL_TABLET | ORAL | 0 refills | Status: DC
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
## 2017-08-19 ENCOUNTER — Encounter: Payer: Self-pay | Admitting: Obstetrics & Gynecology

## 2017-08-19 ENCOUNTER — Ambulatory Visit: Payer: Self-pay | Admitting: Obstetrics & Gynecology

## 2017-09-14 DIAGNOSIS — Z8041 Family history of malignant neoplasm of ovary: Secondary | ICD-10-CM

## 2017-09-14 HISTORY — DX: Family history of malignant neoplasm of ovary: Z80.41

## 2017-09-24 ENCOUNTER — Emergency Department
Admission: EM | Admit: 2017-09-24 | Discharge: 2017-09-24 | Disposition: A | Discharge status: Home / Self Care | Payer: Self-pay | Attending: Emergency Medicine | Admitting: Emergency Medicine

## 2017-09-24 ENCOUNTER — Other Ambulatory Visit: Payer: Self-pay

## 2017-09-24 DIAGNOSIS — R42 Dizziness and giddiness: Secondary | ICD-10-CM | POA: Insufficient documentation

## 2017-09-24 DIAGNOSIS — F17200 Nicotine dependence, unspecified, uncomplicated: Secondary | ICD-10-CM | POA: Insufficient documentation

## 2017-09-24 LAB — URINALYSIS, COMPLETE (UACMP) WITH MICROSCOPIC
BACTERIA UA: NONE SEEN
BILIRUBIN URINE: NEGATIVE
Glucose, UA: NEGATIVE mg/dL
Ketones, ur: NEGATIVE mg/dL
Leukocytes, UA: NEGATIVE
Nitrite: NEGATIVE
PROTEIN: NEGATIVE mg/dL
Specific Gravity, Urine: 1.016 (ref 1.005–1.030)
pH: 7 (ref 5.0–8.0)

## 2017-09-24 LAB — BASIC METABOLIC PANEL
ANION GAP: 9 (ref 5–15)
BUN: 10 mg/dL (ref 6–20)
CO2: 23 mmol/L (ref 22–32)
Calcium: 9.1 mg/dL (ref 8.9–10.3)
Chloride: 107 mmol/L (ref 98–111)
Creatinine, Ser: <0.3 mg/dL — ABNORMAL LOW (ref 0.44–1.00)
GLUCOSE: 121 mg/dL — AB (ref 70–99)
Potassium: 3.7 mmol/L (ref 3.5–5.1)
Sodium: 139 mmol/L (ref 135–145)

## 2017-09-24 LAB — POCT PREGNANCY, URINE: PREG TEST UR: NEGATIVE

## 2017-09-24 LAB — CBC
HEMATOCRIT: 39.8 % (ref 35.0–47.0)
Hemoglobin: 13.6 g/dL (ref 12.0–16.0)
MCH: 27.6 pg (ref 26.0–34.0)
MCHC: 34 g/dL (ref 32.0–36.0)
MCV: 81 fL (ref 80.0–100.0)
Platelets: 224 10*3/uL (ref 150–440)
RBC: 4.92 MIL/uL (ref 3.80–5.20)
RDW: 14.5 % (ref 11.5–14.5)
WBC: 9.3 10*3/uL (ref 3.6–11.0)

## 2017-09-24 MED ORDER — MECLIZINE HCL 25 MG PO TABS: 25.0000 mg | ORAL_TABLET | Freq: Three times a day (TID) | ORAL | 0 refills | Status: DC | PRN

## 2017-09-24 MED ORDER — SODIUM CHLORIDE 0.9 % IV BOLUS
1000.0000 mL | Freq: Once | INTRAVENOUS | Status: AC
  Administered 2017-09-24: 1000 mL via INTRAVENOUS

## 2017-09-24 MED ORDER — MECLIZINE HCL 25 MG PO TABS
25.0000 mg | ORAL_TABLET | Freq: Once | ORAL | Status: AC
  Administered 2017-09-24: 25 mg via ORAL
  Filled 2017-09-24: qty 1

## 2017-09-24 NOTE — ED Notes (Signed)
Pt reports feeling better at this time and that she is ready to go home. MD Derrill KayGoodman made aware.

## 2017-09-24 NOTE — ED Notes (Signed)
Pt verbalizes understanding of d/c instructions, medications and follow up 

## 2017-09-24 NOTE — ED Provider Notes (Signed)
[facility] Emergency Department Provider Note  ____________________________________________   I have reviewed the triage vital signs and the nursing notes.   HISTORY  Chief Complaint Dizziness   History limited by: Not Limited   HPI Kendra Clements is a 37 y.o. female who presents to the emergency department today because of concern for dizziness. Symptoms started this morning and patient first noticed them when she tried getting out of bed. The patient has noticed that the symptoms are worse when the patient is moving her head. She denies any similar symptoms in the past. States that she had a normal day yesterday and was in her normal state of health. The patient denies any recent fevers. No cold like symptoms. She denies any recent head or neck trauma. No recent neck manipulation.    Per medical record review patient has a history of migraines.  Past Medical History:  Diagnosis Date  . Migraines   . Thyroid disease     There are no active problems to display for this patient.   Past Surgical History:  Procedure Laterality Date  . CESAREAN SECTION    . MOUTH SURGERY    . SHOULDER ARTHROSCOPY    . SHOULDER SURGERY      Prior to Admission medications   Medication Sig Start Date End Date Taking? Authorizing Provider  cyclobenzaprine (FLEXERIL) 5 MG tablet Take 1-2 tablets 3 times daily as needed 02/20/17   Enid DerryWagner, Ashley, PA-C  Diphenhyd-Hydrocort-Nystatin (FIRST-DUKES MOUTHWASH) SUSP Use as directed 5 mLs in the mouth or throat 4 (four) times daily -  before meals and at bedtime. 10/06/16   Tommi RumpsSummers, Rhonda L, PA-C  ibuprofen (ADVIL,MOTRIN) 600 MG tablet Take 1 tablet (600 mg total) by mouth every 6 (six) hours as needed. 02/20/17   Enid DerryWagner, Ashley, PA-C    Allergies Tramadol and Tramadol  Family History  Problem Relation Age of Onset  . Hypertension Mother   . Hypothyroidism Maternal Grandmother   . COPD Paternal Grandmother   . Breast  cancer Cousin   . COPD Cousin   . Hypothyroidism Maternal Uncle     Social History Social History   Tobacco Use  . Smoking status: Current Every Day Smoker  . Smokeless tobacco: Never Used  Substance Use Topics  . Alcohol use: Yes  . Drug use: No    Review of Systems Constitutional: No fever/chills Eyes: No visual changes. ENT: No sore throat. Cardiovascular: Denies chest pain. Respiratory: Denies shortness of breath. Gastrointestinal: No abdominal pain.  No nausea, no vomiting.  No diarrhea.   Genitourinary: Negative for dysuria. Musculoskeletal: Negative for back pain. Skin: Negative for rash. Neurological: Positive for dizziness.  ____________________________________________   PHYSICAL EXAM:  VITAL SIGNS: ED Triage Vitals [09/24/17 1707]  Enc Vitals Group     BP (!) 159/82     Pulse Rate (!) 127     Resp 18     Temp 98.6 F (37 C)     Temp Source Oral     SpO2 95 %     Weight 192 lb (87.1 kg)     Height 5\' 3"  (1.6 m)     Head Circumference      Peak Flow      Pain Score 6   Constitutional: Alert and oriented.  Eyes: Conjunctivae are normal.  ENT      Head: Normocephalic and atraumatic.      Nose: No congestion/rhinnorhea.      Mouth/Throat: Mucous membranes are moist.  Neck: No stridor. Hematological/Lymphatic/Immunilogical: No cervical lymphadenopathy. Cardiovascular: Normal rate, regular rhythm.  No murmurs, rubs, or gallops.  Respiratory: Normal respiratory effort without tachypnea nor retractions. Breath sounds are clear and equal bilaterally. No wheezes/rales/rhonchi. Gastrointestinal: Soft and non tender. No rebound. No guarding.  Genitourinary: Deferred Musculoskeletal: Normal range of motion in all extremities. No lower extremity edema. Neurologic:  Normal speech and language. No gross focal neurologic deficits are appreciated.  Skin:  Skin is warm, dry and intact. No rash noted. Psychiatric: Mood and affect are normal. Speech and behavior  are normal. Patient exhibits appropriate insight and judgment.  ____________________________________________    LABS (pertinent positives/negatives)  CBC wnl BMP wnl except glu 121, cr <0.30 Upreg negative UA not consistent with infection ____________________________________________   EKG  I, Phineas Semen, attending physician, personally viewed and interpreted this EKG  EKG Time: 1712 Rate: 107 Rhythm: sinus tachycardia Axis: normal Intervals: qtc 456 QRS: narrow ST changes: no st elevation Impression: abnormal ekg   ____________________________________________    RADIOLOGY  None  ____________________________________________   PROCEDURES  Procedures  ____________________________________________   INITIAL IMPRESSION / ASSESSMENT AND PLAN / ED COURSE  Pertinent labs & imaging results that were available during my care of the patient were reviewed by me and considered in my medical decision making (see chart for details).   Patient presented to the emergency department today because of concerns for dizziness that is worse with head movement.  It does resolve when patient sitting still.  This point I think vertigo BPPV likely.  Doubt central lesion.  She did feel better after fluids and Antivert.  Will plan on discharging with prescription for Antivert.  Will give Epley maneuvers.  Discussed plan with patient.   ____________________________________________   FINAL CLINICAL IMPRESSION(S) / ED DIAGNOSES  Final diagnoses:  Vertigo     Note: This dictation was prepared with Dragon dictation. Any transcriptional errors that result from this process are unintentional     Phineas Semen, MD 09/24/17 2038

## 2017-09-24 NOTE — ED Triage Notes (Signed)
Pt states if she turns her head too fast she gets dizzy. States began this AM. States neck feels stiff. Alert, oriented, ambulatory. No distress noted.

## 2017-09-24 NOTE — Discharge Instructions (Addendum)
Please seek medical attention for any high fevers, chest pain, shortness of breath, change in behavior, persistent vomiting, bloody stool or any other new or concerning symptoms.  

## 2017-09-29 ENCOUNTER — Ambulatory Visit (INDEPENDENT_AMBULATORY_CARE_PROVIDER_SITE_OTHER): Payer: Medicaid Other | Admitting: Obstetrics & Gynecology

## 2017-09-29 ENCOUNTER — Other Ambulatory Visit (HOSPITAL_COMMUNITY)
Admission: RE | Admit: 2017-09-29 | Discharge: 2017-09-29 | Disposition: A | Discharge status: Home / Self Care | Payer: Medicaid Other | Source: Ambulatory Visit | Attending: Obstetrics & Gynecology | Admitting: Obstetrics & Gynecology

## 2017-09-29 ENCOUNTER — Encounter: Payer: Self-pay | Admitting: Obstetrics & Gynecology

## 2017-09-29 VITALS — BP 150/84 | HR 127 | Ht 63.0 in | Wt 190.0 lb

## 2017-09-29 DIAGNOSIS — Z309 Encounter for contraceptive management, unspecified: Secondary | ICD-10-CM

## 2017-09-29 DIAGNOSIS — R1032 Left lower quadrant pain: Secondary | ICD-10-CM

## 2017-09-29 DIAGNOSIS — Z124 Encounter for screening for malignant neoplasm of cervix: Secondary | ICD-10-CM | POA: Diagnosis present

## 2017-09-29 DIAGNOSIS — E669 Obesity, unspecified: Secondary | ICD-10-CM

## 2017-09-29 DIAGNOSIS — Z0000 Encounter for general adult medical examination without abnormal findings: Secondary | ICD-10-CM

## 2017-09-29 MED ORDER — PHENTERMINE HCL 37.5 MG PO TABS: 37.5000 mg | ORAL_TABLET | Freq: Every day | ORAL | 0 refills | Status: DC

## 2017-09-29 NOTE — Progress Notes (Signed)
HPI:      [name] is a 37 y.o. G1P0000 who LMP was Patient's last menstrual period was 09/27/2017 (exact date)., she presents today for her annual examination. The patient has no complaints today other than recurrent LLQ pain and a history of ovarian cysts in the past; pain is non-radiating and without other assoc sx's or modifiers. The patient is intermittantly sexually active. Her last pap: was normal.  Prior h/o abnormal PAP. The patient does perform self breast exams.  There is no notable family history of breast or ovarian cancer in her family.  The patient has regular exercise: yes.  The patient denies current symptoms of depression.    GYN History: Contraception: OCP (estrogen/progesterone) but currently abstinate  PMHx: Past Medical History:  Diagnosis Date  . Migraines   . Thyroid disease    Past Surgical History:  Procedure Laterality Date  . CESAREAN SECTION    . MOUTH SURGERY    . SHOULDER ARTHROSCOPY    . SHOULDER SURGERY     Family History  Problem Relation Age of Onset  . Hypertension Mother   . Hypothyroidism Maternal Grandmother   . COPD Paternal Grandmother   . Breast cancer Cousin   . COPD Cousin   . Hypothyroidism Maternal Uncle    Social History   Tobacco Use  . Smoking status: Current Every Day Smoker  . Smokeless tobacco: Never Used  Substance Use Topics  . Alcohol use: Yes  . Drug use: No    Current Outpatient Medications:  .  meclizine (ANTIVERT) 25 MG tablet, Take 1 tablet (25 mg total) by mouth 3 (three) times daily as needed for dizziness. (Patient not taking: Reported on 09/29/2017), Disp: 30 tablet, Rfl: 0 .  phentermine (ADIPEX-P) 37.5 MG tablet, Take 1 tablet (37.5 mg total) by mouth daily before breakfast., Disp: 30 tablet, Rfl: 0 Allergies: Tramadol  Review of Systems  Constitutional: Negative for chills, fever and malaise/fatigue.  HENT: Negative for congestion, sinus pain and sore throat.   Eyes: Negative for  blurred vision and pain.  Respiratory: Negative for cough and wheezing.   Cardiovascular: Negative for chest pain and leg swelling.  Gastrointestinal: Negative for abdominal pain, constipation, diarrhea, heartburn, nausea and vomiting.  Genitourinary: Negative for dysuria, frequency, hematuria and urgency.  Musculoskeletal: Negative for back pain, joint pain, myalgias and neck pain.  Skin: Negative for itching and rash.  Neurological: Negative for dizziness, tremors and weakness.  Endo/Heme/Allergies: Does not bruise/bleed easily.  Psychiatric/Behavioral: Negative for depression. The patient is not nervous/anxious and does not have insomnia.     Objective: BP (!) 150/84   Pulse (!) 127   Ht 5\' 3"  (1.6 m)   Wt 190 lb (86.2 kg)   LMP 09/27/2017 (Exact Date)   BMI 33.66 kg/m   Filed Weights   09/29/17 1524  Weight: 190 lb (86.2 kg)   Body mass index is 33.66 kg/m. Physical Exam  Constitutional: She is oriented to person, place, and time. She appears well-developed and well-nourished. No distress.  Genitourinary: Rectum normal, vagina normal and uterus normal. Pelvic exam was performed with patient supine. There is no rash or lesion on the right labia. There is no rash or lesion on the left labia. Vagina exhibits no lesion. No bleeding in the vagina. Right adnexum does not display mass and does not display tenderness. Left adnexum does not display mass and does not display tenderness. Cervix does not exhibit motion tenderness, lesion, friability or polyp.  Uterus is mobile and midaxial. Uterus is not enlarged or exhibiting a mass.  HENT:  Head: Normocephalic and atraumatic. Head is without laceration.  Right Ear: Hearing normal.  Left Ear: Hearing normal.  Nose: No epistaxis.  No foreign bodies.  Mouth/Throat: Uvula is midline, oropharynx is clear and moist and mucous membranes are normal.  Eyes: Pupils are equal, round, and reactive to light.  Neck: Normal range of motion. Neck  supple. No thyromegaly present.  Cardiovascular: Normal rate and regular rhythm. Exam reveals no gallop and no friction rub.  No murmur heard. Pulmonary/Chest: Effort normal and breath sounds normal. No respiratory distress. She has no wheezes. Right breast exhibits no mass, no skin change and no tenderness. Left breast exhibits no mass, no skin change and no tenderness.  Abdominal: Soft. Bowel sounds are normal. She exhibits no distension. There is no tenderness. There is no rebound.  Musculoskeletal: Normal range of motion.  Neurological: She is alert and oriented to person, place, and time. No cranial nerve deficit.  Skin: Skin is warm and dry.  Psychiatric: She has a normal mood and affect. Judgment normal.  Vitals reviewed.   Assessment:  ANNUAL EXAM 1. Annual physical exam   2. Screening for cervical cancer   3. LLQ pain   4. Obesity (BMI 30.0-34.9)    Screening Plan:            1.  Cervical Screening-  Pap smear done today  2. Breast screening- Exam annually and mammogram>40 planned   3. Colonoscopy every 10 years, Hemoccult testing - after age 7  4. Labs managed by PCP  5. Counseling for contraception: oral contraceptives (estrogen/progesterone) vs patch; assess after [place]  6. Weight gain, BMI 33 Meds discussed Phentermine Rx  7. LLQ pain, h/o ovarian cysts Consider [place] for cyst/ pain evaluation    F/U  Return in about 1 month (around 10/27/2017) for Follow up.  Annamarie Major, MD, [facility], [facility] 09/29/2017  4:23 PM

## 2017-09-29 NOTE — Patient Instructions (Addendum)
PAP every three years Labs yearly (with PCP)  Phentermine tablets or capsules What is this medicine? PHENTERMINE (FEN ter meen) decreases your appetite. It is used with a reduced calorie diet and exercise to help you lose weight. This medicine may be used for other purposes; ask your health care provider or pharmacist if you have questions. COMMON BRAND NAME(S): Adipex-P, Atti-Plex P, Atti-Plex P Spansule, Fastin, Lomaira, Pro-Fast, Shavelle-8 What should I tell my health care provider before I take this medicine? They need to know if you have any of these conditions: -agitation -glaucoma -heart disease -high blood pressure -history of substance abuse -lung disease called Primary Pulmonary Hypertension (PPH) -taken an MAOI like Carbex, Eldepryl, Marplan, Nardil, or Parnate in last 14 days -thyroid disease -an unusual or allergic reaction to phentermine, other medicines, foods, dyes, or preservatives -pregnant or trying to get pregnant -breast-feeding How should I use this medicine? Take this medicine by mouth with a glass of water. Follow the directions on the prescription label. The instructions for use may differ based on the product and dose you are taking. Avoid taking this medicine in the evening. It may interfere with sleep. Take your doses at regular intervals. Do not take your medicine more often than directed. Talk to your pediatrician regarding the use of this medicine in children. While this drug may be prescribed for children 17 years or older for selected conditions, precautions do apply. Overdosage: If you think you have taken too much of this medicine contact a poison control center or emergency room at once. NOTE: This medicine is only for you. Do not share this medicine with others. What if I miss a dose? If you miss a dose, take it as soon as you can. If it is almost time for your next dose, take only that dose. Do not take double or extra doses. What may interact with this  medicine? Do not take this medicine with any of the following medications: -duloxetine -MAOIs like Carbex, Eldepryl, Marplan, Nardil, and Parnate -medicines for colds or breathing difficulties like pseudoephedrine or phenylephrine -procarbazine -sibutramine -SSRIs like citalopram, escitalopram, fluoxetine, fluvoxamine, paroxetine, and sertraline -stimulants like dexmethylphenidate, methylphenidate or modafinil -venlafaxine This medicine may also interact with the following medications: -medicines for diabetes This list may not describe all possible interactions. Give your health care provider a list of all the medicines, herbs, non-prescription drugs, or dietary supplements you use. Also tell them if you smoke, drink alcohol, or use illegal drugs. Some items may interact with your medicine. What should I watch for while using this medicine? Notify your physician immediately if you become short of breath while doing your normal activities. Do not take this medicine within 6 hours of bedtime. It can keep you from getting to sleep. Avoid drinks that contain caffeine and try to stick to a regular bedtime every night. This medicine was intended to be used in addition to a healthy diet and exercise. The best results are achieved this way. This medicine is only indicated for short-term use. Eventually your weight loss may level out. At that point, the drug will only help you maintain your new weight. Do not increase or in any way change your dose without consulting your doctor. You may get drowsy or dizzy. Do not drive, use machinery, or do anything that needs mental alertness until you know how this medicine affects you. Do not stand or sit up quickly, especially if you are an older patient. This reduces the risk of dizzy or fainting  spells. Alcohol may increase dizziness and drowsiness. Avoid alcoholic drinks. What side effects may I notice from receiving this medicine? Side effects that you should  report to your doctor or health care professional as soon as possible: -chest pain, palpitations -depression or severe changes in mood -increased blood pressure -irritability -nervousness or restlessness -severe dizziness -shortness of breath -problems urinating -unusual swelling of the legs -vomiting Side effects that usually do not require medical attention (report to your doctor or health care professional if they continue or are bothersome): -blurred vision or other eye problems -changes in sexual ability or desire -constipation or diarrhea -difficulty sleeping -dry mouth or unpleasant taste -headache -nausea This list may not describe all possible side effects. Call your doctor for medical advice about side effects. You may report side effects to FDA at 1-800-FDA-1088. Where should I keep my medicine? Keep out of the reach of children. This medicine can be abused. Keep your medicine in a safe place to protect it from theft. Do not share this medicine with anyone. Selling or giving away this medicine is dangerous and against the law. This medicine may cause accidental overdose and death if taken by other adults, children, or pets. Mix any unused medicine with a substance like cat litter or coffee grounds. Then throw the medicine away in a sealed container like a sealed bag or a coffee can with a lid. Do not use the medicine after the expiration date. Store at room temperature between 20 and 25 degrees C (68 and 77 degrees F). Keep container tightly closed. NOTE: This sheet is a summary. It may not cover all possible information. If you have questions about this medicine, talk to your doctor, pharmacist, or health care provider.  2018 Elsevier/Gold Standard (2014-12-08 12:53:15)

## 2017-10-01 LAB — CYTOLOGY - PAP
DIAGNOSIS: NEGATIVE
HPV: NOT DETECTED

## 2017-10-05 ENCOUNTER — Encounter: Payer: Self-pay | Admitting: Obstetrics and Gynecology

## 2017-10-13 ENCOUNTER — Other Ambulatory Visit: Payer: Medicaid Other

## 2017-10-13 ENCOUNTER — Ambulatory Visit: Payer: Medicaid Other | Admitting: Obstetrics & Gynecology

## 2017-11-06 ENCOUNTER — Ambulatory Visit: Payer: Medicaid Other | Admitting: Obstetrics & Gynecology

## 2017-11-06 ENCOUNTER — Other Ambulatory Visit: Payer: Medicaid Other

## 2018-01-11 ENCOUNTER — Other Ambulatory Visit: Payer: Self-pay

## 2018-01-11 ENCOUNTER — Encounter: Payer: Self-pay | Admitting: Emergency Medicine

## 2018-01-11 ENCOUNTER — Emergency Department
Admission: EM | Admit: 2018-01-11 | Discharge: 2018-01-11 | Disposition: A | Discharge status: Home / Self Care | Payer: Medicaid Other | Attending: Emergency Medicine | Admitting: Emergency Medicine

## 2018-01-11 DIAGNOSIS — M2550 Pain in unspecified joint: Secondary | ICD-10-CM | POA: Insufficient documentation

## 2018-01-11 DIAGNOSIS — R1084 Generalized abdominal pain: Secondary | ICD-10-CM | POA: Insufficient documentation

## 2018-01-11 DIAGNOSIS — Z5321 Procedure and treatment not carried out due to patient leaving prior to being seen by health care provider: Secondary | ICD-10-CM | POA: Insufficient documentation

## 2018-01-11 LAB — BASIC METABOLIC PANEL
ANION GAP: 7 (ref 5–15)
BUN: 9 mg/dL (ref 6–20)
CO2: 27 mmol/L (ref 22–32)
Calcium: 9.3 mg/dL (ref 8.9–10.3)
Chloride: 104 mmol/L (ref 98–111)
Creatinine, Ser: 0.41 mg/dL — ABNORMAL LOW (ref 0.44–1.00)
GFR calc Af Amer: >60 mL/min (ref >60)
GFR calc non Af Amer: >60 mL/min (ref >60)
GLUCOSE: 283 mg/dL — AB (ref 70–99)
Potassium: 3.8 mmol/L (ref 3.5–5.1)
Sodium: 138 mmol/L (ref 135–145)

## 2018-01-11 LAB — CBC
HCT: 41.8 % (ref 36.0–46.0)
Hemoglobin: 13.7 g/dL (ref 12.0–15.0)
MCH: 27 pg (ref 26.0–34.0)
MCHC: 32.8 g/dL (ref 30.0–36.0)
MCV: 82.3 fL (ref 80.0–100.0)
PLATELETS: 244 10*3/uL (ref 150–400)
RBC: 5.08 MIL/uL (ref 3.87–5.11)
RDW: 13.1 % (ref 11.5–15.5)
WBC: 8.6 10*3/uL (ref 4.0–10.5)
nRBC: 0 % (ref 0.0–0.2)

## 2018-01-11 LAB — URINALYSIS, COMPLETE (UACMP) WITH MICROSCOPIC
BACTERIA UA: NONE SEEN
Bilirubin Urine: NEGATIVE
KETONES UR: NEGATIVE mg/dL
LEUKOCYTES UA: NEGATIVE
Nitrite: NEGATIVE
PH: 7 (ref 5.0–8.0)
PROTEIN: NEGATIVE mg/dL
Specific Gravity, Urine: 1.018 (ref 1.005–1.030)

## 2018-01-11 LAB — POCT PREGNANCY, URINE: Preg Test, Ur: NEGATIVE

## 2018-01-11 NOTE — ED Triage Notes (Signed)
Pt via pov from home with generalized joint pain. She states that she began having ear pain 2 days ago and now her neck, shoulders, elbows, other joints hurt. Pt denies any other symptoms except "slight nausea." Pt alert & oriented with NAD noted.

## 2018-01-12 ENCOUNTER — Telehealth: Payer: Self-pay | Admitting: Emergency Medicine

## 2018-01-12 NOTE — Telephone Encounter (Signed)
Called patient due to lwot to inquire about condition and follow up plans.  She says she does not have a doctor and no insurance right now.  I explained the [facility] and encouraged her to set up care there.  I explained that she can always return here, but that she needs further testing as her glucose was high.

## 2018-08-16 IMAGING — CR DG CHEST 2V
1 series · 2 of 2 positions shown · non-contrast
Comparison: 03/23/2016.

CLINICAL DATA: Chest pain, shortness of breath.

EXAM:
CHEST  2 VIEW

[Series 1: dg chest 2 view · 0.14mm/px · 2 of 2 slices shown]
[im 1/2]
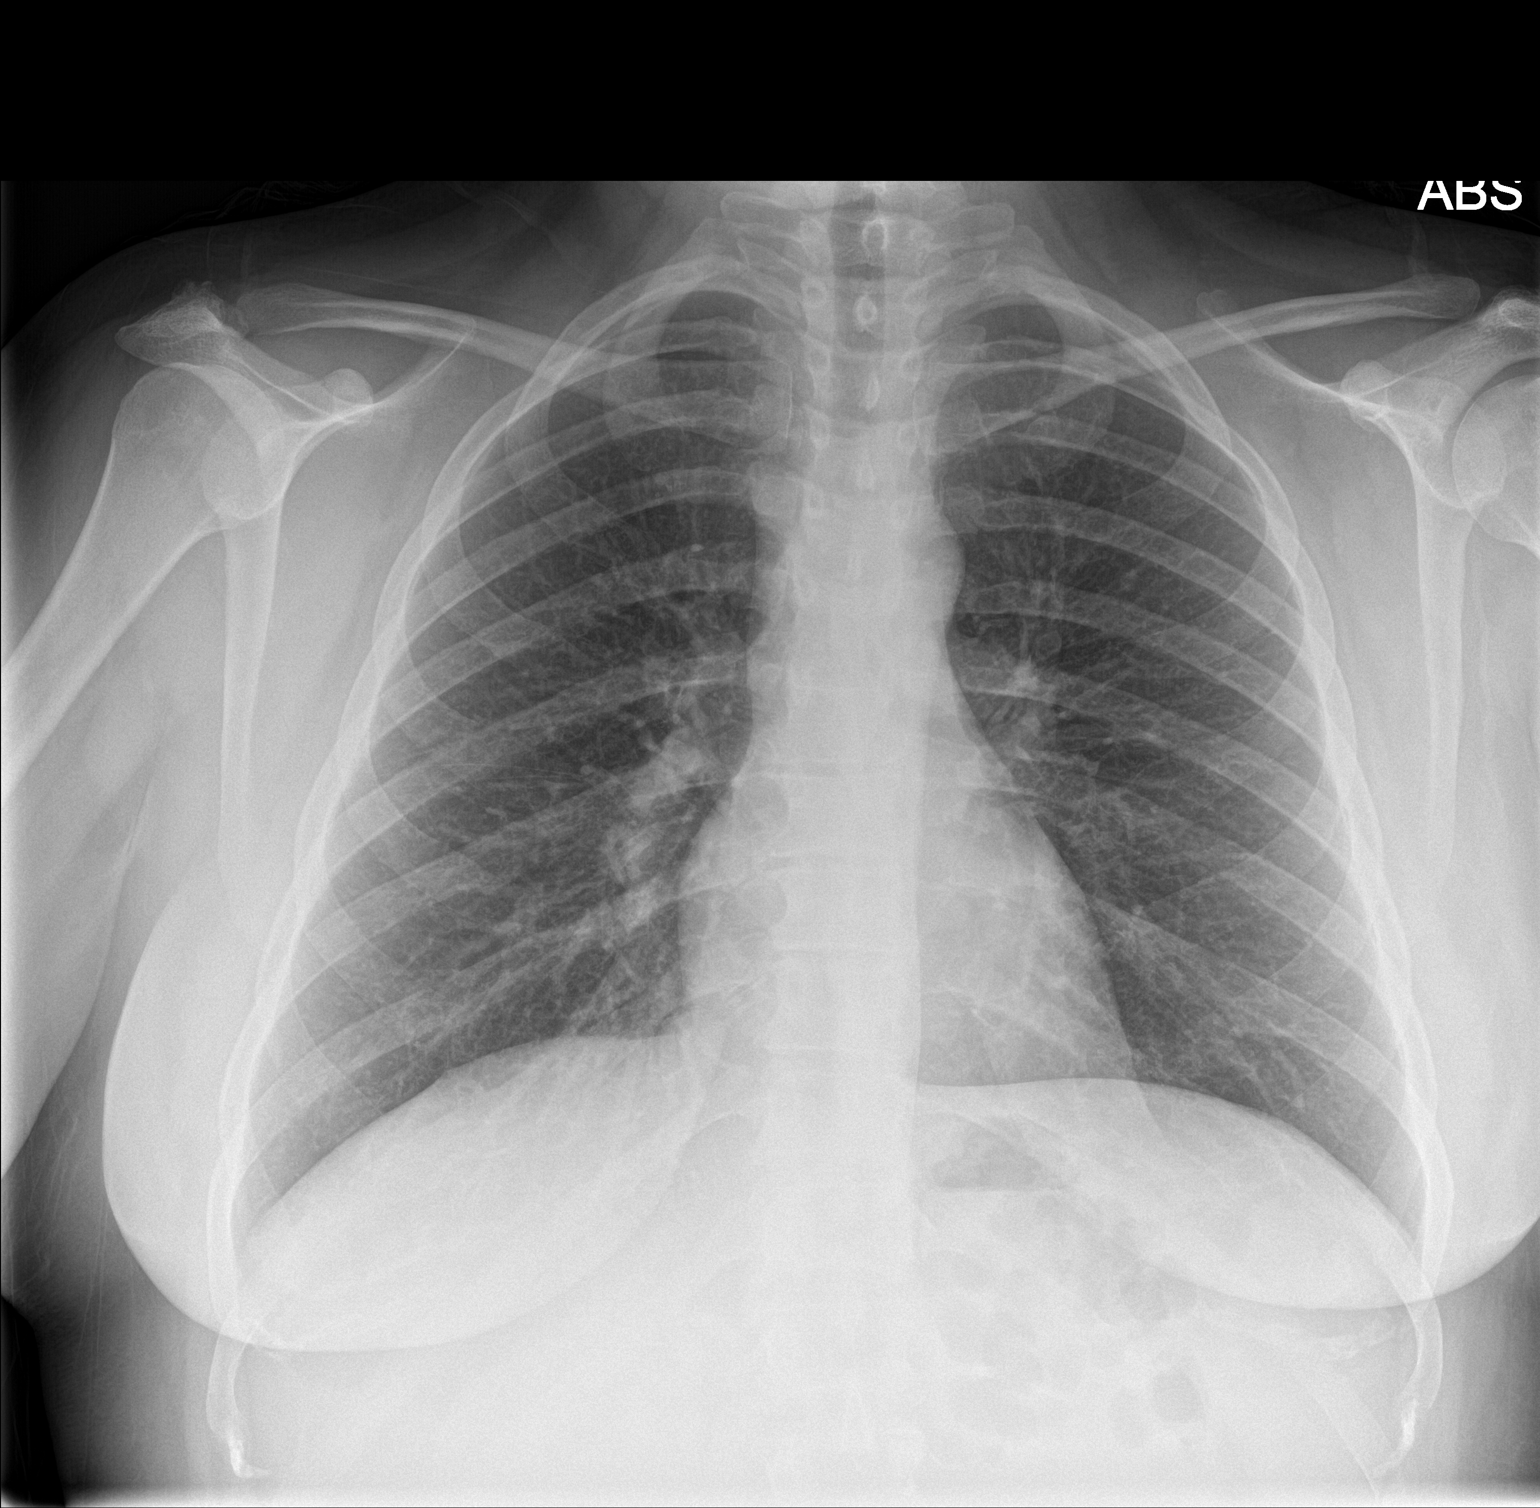
[im 2/2]
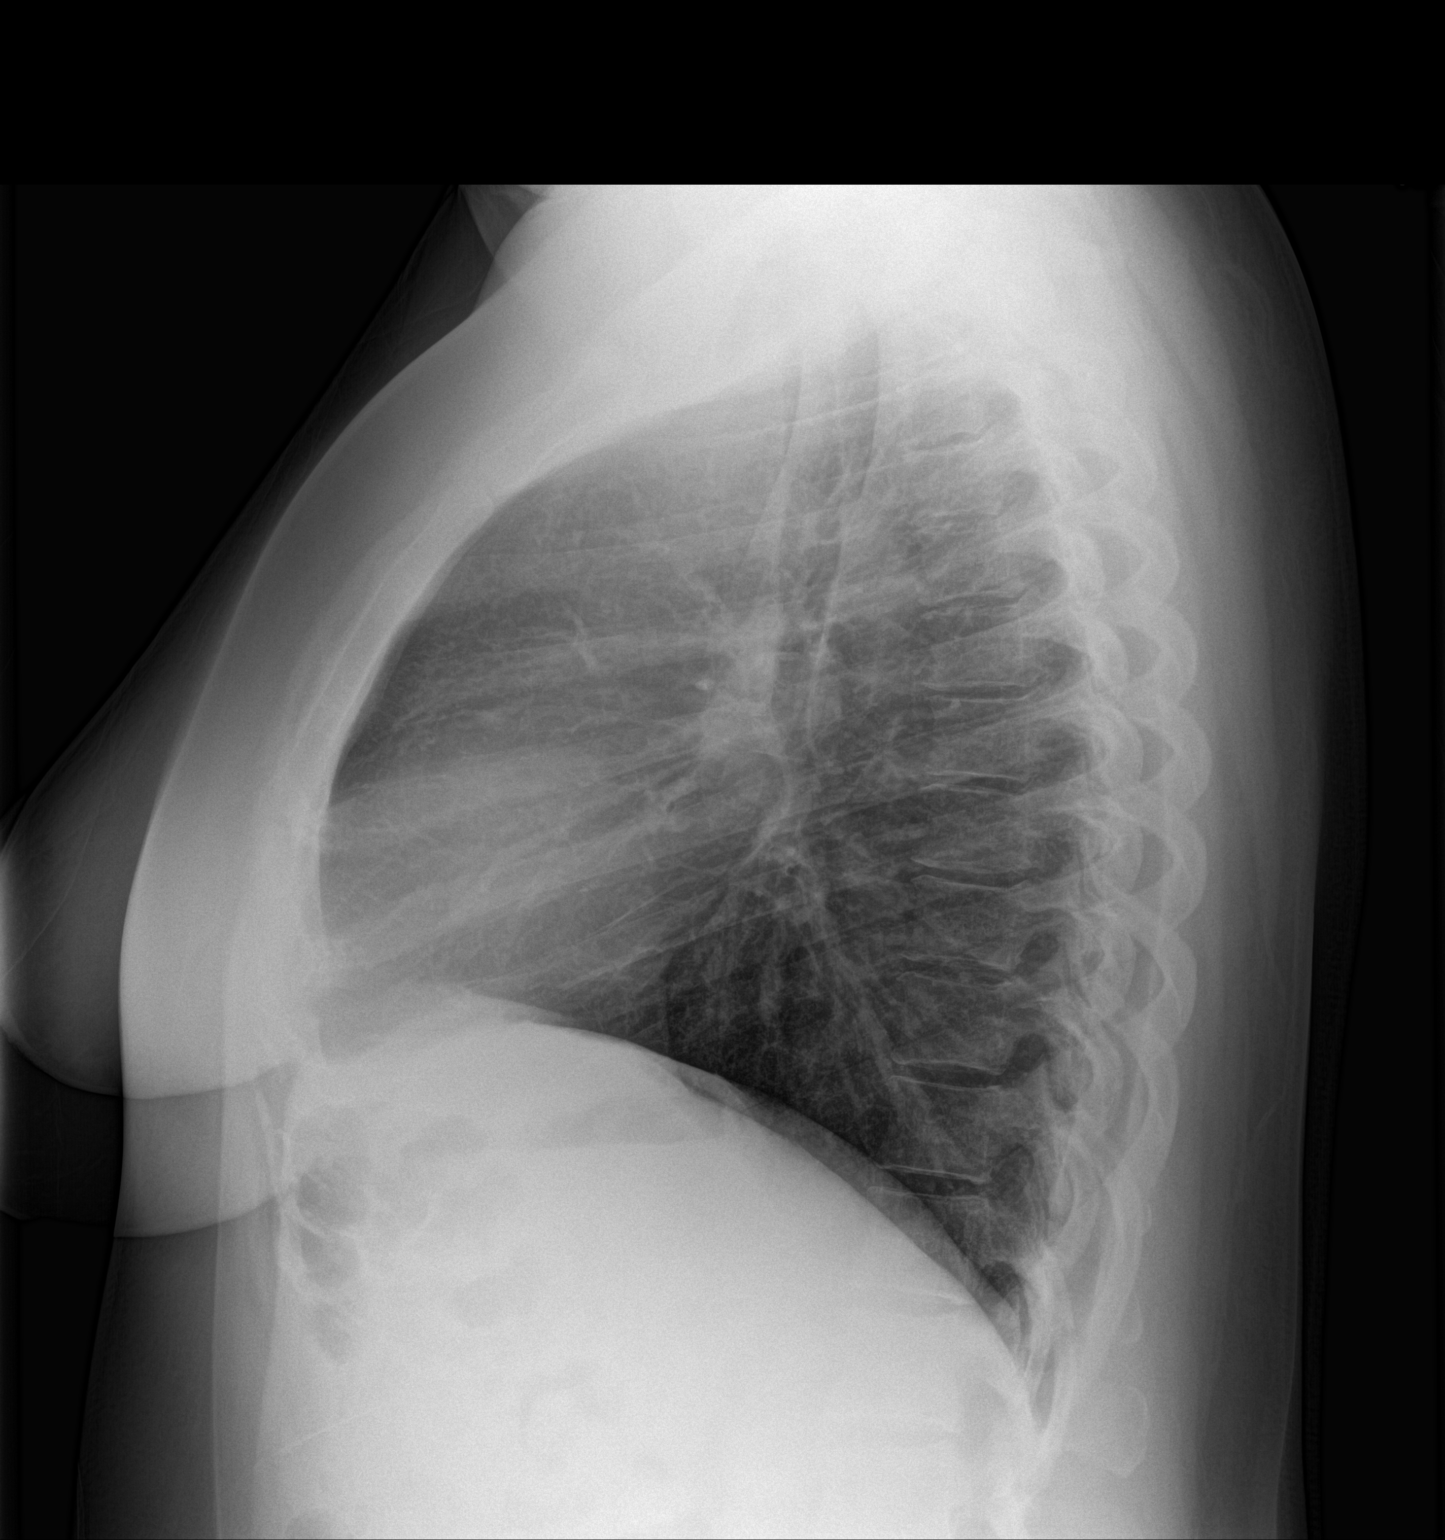

[2 of 2 positions shown; findings below may reference images not displayed]

FINDINGS: Trachea is midline. Heart size normal. Lungs are clear. No pleural
fluid.
IMPRESSION: Negative.

## 2020-01-31 ENCOUNTER — Ambulatory Visit: Payer: Medicaid Other | Admitting: Obstetrics and Gynecology

## 2020-02-02 ENCOUNTER — Other Ambulatory Visit: Payer: Medicaid Other

## 2020-02-02 ENCOUNTER — Other Ambulatory Visit (HOSPITAL_COMMUNITY)
Admission: RE | Admit: 2020-02-02 | Discharge: 2020-02-02 | Disposition: A | Discharge status: Home / Self Care | Payer: Medicaid Other | Source: Ambulatory Visit | Attending: Obstetrics & Gynecology | Admitting: Obstetrics & Gynecology

## 2020-02-02 ENCOUNTER — Encounter: Payer: Self-pay | Admitting: Obstetrics & Gynecology

## 2020-02-02 ENCOUNTER — Other Ambulatory Visit: Payer: Self-pay

## 2020-02-02 ENCOUNTER — Ambulatory Visit (INDEPENDENT_AMBULATORY_CARE_PROVIDER_SITE_OTHER): Payer: Medicaid Other | Admitting: Obstetrics & Gynecology

## 2020-02-02 VITALS — BP 150/100 | Ht 63.0 in | Wt 213.0 lb

## 2020-02-02 DIAGNOSIS — Z202 Contact with and (suspected) exposure to infections with a predominantly sexual mode of transmission: Secondary | ICD-10-CM | POA: Insufficient documentation

## 2020-02-02 MED ORDER — CEFTRIAXONE SODIUM 250 MG IJ SOLR
250.0000 mg | Freq: Once | INTRAMUSCULAR | Status: AC
  Administered 2020-02-02: 250 mg via INTRAMUSCULAR

## 2020-02-02 NOTE — Progress Notes (Signed)
Gynecology STD Evaluation   Chief Complaint  Patient presents with  . Exposure to STD    History of Present Illness: Patient is a 39 y.o. G1P1 presents for STD testing.  Recent exposure, BF infidelity and reports new partner had gonorrhea.  The patient has noted intermenstrual spotting in that she has been having periods every 2 weeks for the last 6 weeks.  She has not experienced postcoital bleeding, and does not report increased vaginal discharge.  There is not a history of prior sexually transmitted infection(s).    The patient is sexually active and reports more than one lifetime sexual partners . She currently uses no meds and rare condoms for contraception. sometimes, the patient also relies on condoms to prevent the spread of sexually transmitted infections.  She patient has been previously transfused or tattooed.    PMHx: She  has a past medical history of Family history of ovarian cancer (09/2017), Migraines, and Thyroid disease. Also,  has a past surgical history that includes Shoulder arthroscopy; Cesarean section; Shoulder surgery; and Mouth surgery., family history includes Breast cancer (age of onset: 56) in her cousin; COPD in her cousin and paternal grandmother; Hypertension in her mother; Hypothyroidism in her maternal grandmother and maternal uncle; Ovarian cancer (age of onset: 41) in her mother; Ovarian cancer (age of onset: 59) in her cousin.,  reports that she has been smoking cigarettes. She has been smoking about 1.00 pack per day. She has never used smokeless tobacco. She reports current alcohol use. She reports that she does not use drugs.  She has a current medication list which includes the following prescription(s): meclizine and phentermine, and the following Facility-Administered Medications: ceftriaxone. Also, is allergic to tramadol.  Review of Systems  Constitutional: Negative for chills, fever and malaise/fatigue.  HENT: Negative for congestion, sinus pain and  sore throat.   Eyes: Negative for blurred vision and pain.  Respiratory: Negative for cough and wheezing.   Cardiovascular: Negative for chest pain and leg swelling.  Gastrointestinal: Negative for abdominal pain, constipation, diarrhea, heartburn, nausea and vomiting.  Genitourinary: Negative for dysuria, frequency, hematuria and urgency.  Musculoskeletal: Negative for back pain, joint pain, myalgias and neck pain.  Skin: Negative for itching and rash.  Neurological: Negative for dizziness, tremors and weakness.  Endo/Heme/Allergies: Does not bruise/bleed easily.  Psychiatric/Behavioral: Negative for depression. The patient is not nervous/anxious and does not have insomnia.     Objective: BP (!) 150/100   Ht 5\' 3"  (1.6 m)   Wt 213 lb (96.6 kg)   LMP 01/19/2020   BMI 37.73 kg/m  Physical Exam Constitutional:      General: She is not in acute distress.    Appearance: She is well-developed.  Genitourinary:     Pelvic exam was performed with patient supine.     Vagina and uterus normal.     No vaginal erythema or bleeding.     No cervical motion tenderness, discharge, polyp or nabothian cyst.     Uterus is mobile.     Uterus is not enlarged.     No uterine mass detected.    Uterus is midaxial.     No right or left adnexal mass present.     Right adnexa not tender.     Left adnexa not tender.     Genitourinary Comments: Scant d/c No CMT  HENT:     Head: Normocephalic and atraumatic.     Nose: Nose normal.  Abdominal:     General: There is  no distension.     Palpations: Abdomen is soft.     Tenderness: There is no abdominal tenderness.  Musculoskeletal:        General: Normal range of motion.  Neurological:     Mental Status: She is alert and oriented to person, place, and time.     Cranial Nerves: No cranial nerve deficit.  Skin:    General: Skin is warm and dry.  Psychiatric:        Attention and Perception: Attention normal.        Mood and Affect: Mood and affect  normal.        Speech: Speech normal.        Behavior: Behavior normal.        Thought Content: Thought content normal.        Judgment: Judgment normal.     Female chaperone present for pelvic and breast  portions of the physical exam  Assessment: 39 y.o. G1P0000 No problem-specific Assessment & Plan notes found for this encounter.   Plan: Problem List Items Addressed This Visit   Visit Diagnoses    STD exposure    -  Primary   Relevant Orders   Cervicovaginal ancillary only   STD Panel   Hepatitis C antibody    1) Contraception - Education given regarding options for contraception, including barrier methods.  2) STI screening was offered and done  3) STD prevention counseled No longer with this partner  4) Labs swabbed today and blood work tomorrow (based on time of visit)  Annamarie Major, MD, [facility], [facility] 02/02/2020  4:53 PM

## 2020-02-03 ENCOUNTER — Other Ambulatory Visit: Payer: Medicaid Other

## 2020-02-03 DIAGNOSIS — Z202 Contact with and (suspected) exposure to infections with a predominantly sexual mode of transmission: Secondary | ICD-10-CM

## 2020-02-05 ENCOUNTER — Other Ambulatory Visit: Payer: Self-pay

## 2020-02-06 ENCOUNTER — Telehealth: Payer: Self-pay

## 2020-02-06 LAB — CERVICOVAGINAL ANCILLARY ONLY
Chlamydia: NEGATIVE
Comment: NEGATIVE
Comment: NEGATIVE
Comment: NORMAL
Neisseria Gonorrhea: NEGATIVE
Trichomonas: NEGATIVE

## 2020-02-06 MED ORDER — DOXYCYCLINE HYCLATE 100 MG PO CAPS: 100.0000 mg | ORAL_CAPSULE | Freq: Two times a day (BID) | ORAL | 0 refills | Status: AC

## 2020-02-06 NOTE — Telephone Encounter (Signed)
Pt aware.

## 2020-02-06 NOTE — Telephone Encounter (Signed)
Pt calling triage and states [facility] does not have her RX that RPH was supposed to send in. Please advise

## 2020-02-07 LAB — RPR+HSVIGM+HBSAG+HSV2(IGG)+...
HIV Screen 4th Generation wRfx: NONREACTIVE
HSV 2 IgG, Type Spec: <0.91 index (ref 0.00–0.90)
HSVI/II Comb IgM: <0.91 Ratio (ref 0.00–0.90)
Hepatitis B Surface Ag: NEGATIVE
RPR Ser Ql: NONREACTIVE

## 2020-02-07 LAB — HEPATITIS C ANTIBODY: Hep C Virus Ab: 0.3 s/co ratio (ref 0.0–0.9)

## 2020-03-12 ENCOUNTER — Encounter: Payer: Self-pay | Admitting: Obstetrics and Gynecology

## 2020-03-12 ENCOUNTER — Ambulatory Visit (INDEPENDENT_AMBULATORY_CARE_PROVIDER_SITE_OTHER): Payer: Medicaid Other | Admitting: Obstetrics and Gynecology

## 2020-03-12 ENCOUNTER — Other Ambulatory Visit: Payer: Self-pay

## 2020-03-12 ENCOUNTER — Other Ambulatory Visit (HOSPITAL_COMMUNITY)
Admission: RE | Admit: 2020-03-12 | Discharge: 2020-03-12 | Disposition: A | Discharge status: Home / Self Care | Payer: Medicaid Other | Source: Ambulatory Visit | Attending: Obstetrics and Gynecology | Admitting: Obstetrics and Gynecology

## 2020-03-12 VITALS — BP 138/88 | Ht 63.0 in | Wt 218.0 lb

## 2020-03-12 DIAGNOSIS — Z124 Encounter for screening for malignant neoplasm of cervix: Secondary | ICD-10-CM | POA: Insufficient documentation

## 2020-03-12 DIAGNOSIS — Z6838 Body mass index (BMI) 38.0-38.9, adult: Secondary | ICD-10-CM

## 2020-03-12 DIAGNOSIS — Z1339 Encounter for screening examination for other mental health and behavioral disorders: Secondary | ICD-10-CM | POA: Diagnosis not present

## 2020-03-12 DIAGNOSIS — R011 Cardiac murmur, unspecified: Secondary | ICD-10-CM

## 2020-03-12 DIAGNOSIS — N921 Excessive and frequent menstruation with irregular cycle: Secondary | ICD-10-CM

## 2020-03-12 DIAGNOSIS — Z1331 Encounter for screening for depression: Secondary | ICD-10-CM

## 2020-03-12 DIAGNOSIS — Z01419 Encounter for gynecological examination (general) (routine) without abnormal findings: Secondary | ICD-10-CM | POA: Insufficient documentation

## 2020-03-12 DIAGNOSIS — E6609 Other obesity due to excess calories: Secondary | ICD-10-CM

## 2020-03-12 MED ORDER — PHENTERMINE HCL 37.5 MG PO TABS: 37.5000 mg | ORAL_TABLET | Freq: Every day | ORAL | 0 refills | Status: DC

## 2020-03-12 NOTE — Progress Notes (Signed)
Gynecology Annual Exam  PCP: Evelene Croon, MD  Chief Complaint  Patient presents with  . Annual Exam   History of Present Illness:  [name] is a 39 y.o. G1P0000 who LMP was Patient's last menstrual period was 02/15/2020., presents today for her annual examination.  Her menses are regular every 28-30 days, lasting 7 day(s).  Dysmenorrhea moderate, occurring first 1-2 days of flow. She does have intermenstrual bleeding.  For the past 3-4 months she has been having bleeding mid-cycle.  This is spotting, then clots, then heavy, then spotting again.  This is not associated with cramping like her regular periods.  During regular periods her periods are very heavy, she is filling a super-plus tampon, an overnight pad, and beyond every 30 minutes.  This lasts 2-3 days.    She is sexually active. She uses condoms most of the time.    Last Pap: 09/2017,  Results were: no abnormalities /neg HPV DNA negative Hx of STDs: none  There is no FH of breast cancer. There is a FH of ovarian cancer. The patient does do self-breast exams.  Tobacco use: 1/2 ppd  Alcohol use: social drinker Exercise: trying to get more regular, walks 1 mile 3 x week + walking the dog.  The patient wears seatbelts: yes.   The patient reports that domestic violence in her life is absent.   She would like to talk about weight loss.   Past Medical History:  Diagnosis Date  . Family history of ovarian cancer 09/2017   cancer genetic testing letter sent  . Migraines   . Thyroid disease     Past Surgical History:  Procedure Laterality Date  . CESAREAN SECTION    . MOUTH SURGERY    . SHOULDER ARTHROSCOPY    . SHOULDER SURGERY      Prior to Admission medications: denies   Allergies  Allergen Reactions  . Tramadol Nausea And Vomiting   Obstetric History: G1P0000  Social History   Socioeconomic History  . Marital status: Single    Spouse name: Not on file  . Number of children: Not on file  .  Years of education: Not on file  . Highest education level: Not on file  Occupational History  . Not on file  Tobacco Use  . Smoking status: Current Every Day Smoker    Packs/day: 1.00    Types: Cigarettes  . Smokeless tobacco: Never Used  Vaping Use  . Vaping Use: Never used  Substance and Sexual Activity  . Alcohol use: Yes    Comment: occasionally  . Drug use: No  . Sexual activity: Yes    Birth control/protection: None  Other Topics Concern  . Not on file  Social History Narrative   ** Merged History Encounter **       Social Determinants of Health   Financial Resource Strain: Not on file  Food Insecurity: Not on file  Transportation Needs: Not on file  Physical Activity: Not on file  Stress: Not on file  Social Connections: Not on file  Intimate Partner Violence: Not on file    Family History  Problem Relation Age of Onset  . Hypertension Mother   . Ovarian cancer Mother 94  . Hypothyroidism Maternal Grandmother   . COPD Paternal Grandmother   . Breast cancer Cousin 40  . Ovarian cancer Cousin 40  . COPD Cousin   . Hypothyroidism Maternal Uncle     Review of Systems  Constitutional: Negative for chills and  fever.  HENT: Negative for congestion, ear discharge, ear pain, hearing loss, sinus pain and sore throat.   Eyes: Negative for blurred vision and double vision.  Respiratory: Negative for cough, shortness of breath and wheezing.   Cardiovascular: Negative for chest pain, palpitations and leg swelling.  Gastrointestinal: Negative for abdominal pain, blood in stool, constipation, diarrhea, heartburn, melena, nausea and vomiting.  Genitourinary: Negative for dysuria, flank pain, frequency, hematuria and urgency.  Musculoskeletal: Negative for back pain, joint pain and myalgias.  Skin: Negative for itching and rash.  Neurological: Negative for dizziness, tingling, tremors, sensory change, speech change, focal weakness, seizures, loss of consciousness,  weakness and headaches.  Endo/Heme/Allergies: Negative for environmental allergies. Does not bruise/bleed easily.  Psychiatric/Behavioral: Negative for depression, hallucinations, memory loss, substance abuse and suicidal ideas. The patient is not nervous/anxious and does not have insomnia.      Physical Exam BP 138/88   Ht 5\' 3"  (1.6 m)   Wt 218 lb (98.9 kg)   LMP 02/15/2020   BMI 38.62 kg/m   Physical Exam Constitutional:      General: She is not in acute distress.    Appearance: Normal appearance. She is well-developed.  Genitourinary:     Vulva and bladder normal.     Right Labia: No rash, tenderness, lesions, skin changes or Bartholin's cyst.    Left Labia: No tenderness, lesions, skin changes, Bartholin's cyst or rash.    No inguinal adenopathy present in the right or left side.    Pelvic Tanner Score: 5/5.    No vaginal discharge, erythema, tenderness or bleeding.      Right Adnexa: not tender, not full and no mass present.    Left Adnexa: not tender, not full and no mass present.    No cervical motion tenderness, discharge, lesion or polyp.     Uterus is not enlarged or tender.     No uterine mass detected.    Pelvic exam was performed with patient in the lithotomy position.  Breasts:     Right: No inverted nipple, mass, nipple discharge, skin change or tenderness.     Left: No inverted nipple, mass, nipple discharge, skin change or tenderness.    HENT:     Head: Normocephalic and atraumatic.  Eyes:     General: No scleral icterus.    Conjunctiva/sclera: Conjunctivae normal.  Neck:     Thyroid: No thyromegaly.  Cardiovascular:     Rate and Rhythm: Normal rate and regular rhythm.     Heart sounds: Murmur (grade II/VI SEM) heard.  No friction rub. No gallop.   Pulmonary:     Effort: Pulmonary effort is normal. No respiratory distress.     Breath sounds: Normal breath sounds. No wheezing or rales.  Abdominal:     General: Bowel sounds are normal. There is no  distension.     Palpations: Abdomen is soft. There is no mass.     Tenderness: There is no abdominal tenderness. There is no guarding or rebound.     Hernia: There is no hernia in the left inguinal area or right inguinal area.  Musculoskeletal:        General: No swelling or tenderness. Normal range of motion.     Cervical back: Normal range of motion and neck supple.  Lymphadenopathy:     Cervical: No cervical adenopathy.     Lower Body: No right inguinal adenopathy. No left inguinal adenopathy.  Neurological:     General: No focal deficit present.  Mental Status: She is alert and oriented to person, place, and time.     Cranial Nerves: No cranial nerve deficit.  Skin:    General: Skin is warm and dry.     Findings: No erythema or rash.  Psychiatric:        Mood and Affect: Mood normal.        Behavior: Behavior normal.        Judgment: Judgment normal.     Female chaperone present for pelvic and breast  portions of the physical exam  Results: AUDIT Questionnaire (screen for alcoholism): 4 PHQ-9: 2  Assessment: 39 y.o. G61P0000 female here for routine annual gynecologic examination  Plan: Problem List Items Addressed This Visit   None   Visit Diagnoses    Women's annual routine gynecological examination    -  Primary   Screening for depression       Screening for alcoholism       Pap smear for cervical cancer screening       Heart murmur       Menorrhagia with irregular cycle       Relevant Orders   US PELVIS TRANSVAGINAL NON-OB (TV ONLY)   Class 2 obesity due to excess calories without serious comorbidity with body mass index (BMI) of 38.0 to 38.9 in adult       Relevant Medications   phentermine (ADIPEX-P) 37.5 MG tablet      Screening: -- Blood pressure screen elevated: continued to monitor. -- Weight screening: obese: discussed management options, including lifestyle, dietary, and exercise. -- Depression screening negative (PHQ-9) -- Nutrition: normal --  cholesterol screening: per PCP -- osteoporosis screening: not due -- tobacco screening: using: discussed quitting using the 5 A's -- alcohol screening: AUDIT questionnaire indicates low-risk usage. -- family history of breast cancer screening: done. not at high risk. -- no evidence of domestic violence or intimate partner violence. -- STD screening: gonorrhea/chlamydia NAAT not collected per patient request. -- pap smear collected per ASCCP guidelines  Obesity: start phentermine. She has taken before without issue  Menorrhagia: pelvic u/s. Ideally would obtain CBC and TSH/FT4 with heart murmur. She will need to get at her PCP.   Thomasene Mohair, MD 03/12/2020 10:01 AM

## 2020-03-14 LAB — CYTOLOGY - PAP
Comment: NEGATIVE
Diagnosis: NEGATIVE
High risk HPV: NEGATIVE

## 2020-03-30 ENCOUNTER — Ambulatory Visit: Payer: Medicaid Other

## 2020-03-30 ENCOUNTER — Ambulatory Visit: Payer: Medicaid Other | Admitting: Obstetrics and Gynecology

## 2020-06-22 ENCOUNTER — Telehealth: Payer: Medicaid Other | Admitting: Family

## 2020-06-22 DIAGNOSIS — G43909 Migraine, unspecified, not intractable, without status migrainosus: Secondary | ICD-10-CM

## 2020-06-22 MED ORDER — SUMATRIPTAN SUCCINATE 50 MG PO TABS
50.0000 mg | ORAL_TABLET | ORAL | 0 refills | Status: DC | PRN
Start: 2020-06-22 — End: 2023-10-27

## 2020-06-22 MED ORDER — ONDANSETRON HCL 4 MG PO TABS: 4.0000 mg | ORAL_TABLET | Freq: Three times a day (TID) | ORAL | 0 refills | Status: DC | PRN

## 2020-06-22 NOTE — Progress Notes (Signed)
Based on what you shared with me, I feel your condition warrants further evaluation and I recommend that you be seen for a face to face office visit.   Given you are having a migraine, you need to be seen in person. I do not see where you have a history of migraines in your chart. It would be best to be evaluated to rule out other serious causes.    NOTE: If you entered your credit card information for this eVisit, you will not be charged. You may see a "hold" on your card for the $35 but that hold will drop off and you will not have a charge processed.   If you are having a true medical emergency please call 911.      For an urgent face to face visit, [facility] has five urgent care centers for your convenience:     [facility] at [facility] Directions [phone] [address] [place], [place] [address] . 10 am - 6pm Monday - Friday    [facility]) Get Driving Directions [phone] [address] [place], [place] [address] . 10 am to 8 pm Monday-Friday . 12 pm to 8 pm [facility] Urgent Care at [facility] Get Driving Directions [phone] [address], [address] [place], [place] [address] . 8 am to 8 pm Monday-Friday . 9 am to 6 pm Saturday . 11 am to 6 pm Sunday     [facility] at [facility] Get Driving Directions  [phone] [address] [place], [place] [address] . 8 am to 8 pm Monday-Friday . 8 am to 4 pm [facility] Urgent Care at [facility] Directions [phone] [address] [place], [place] [address] . 12 pm to 6 pm Monday-Friday      Your e-visit answers were reviewed by a board certified advanced clinical practitioner to complete your personal care plan.  Thank you for using e-Visits.

## 2020-06-22 NOTE — Addendum Note (Signed)
Addended by: Jannifer Rodney A on: 06/22/2020 01:39 PM   Modules accepted: Orders

## 2020-06-22 NOTE — Progress Notes (Signed)
Ok, since these are similar to your migraines in the past we will go ahead and give you something. However, it is very important if your symptoms worsen or do not improve over the next 24 hours go to the ED or Urgent Care.   I have sent in Imitrex 50 mg that you may take every 2 hours for a max dose of 200 mg in 24 hours. This should "abort" the headache. I have also sent in zofran to help with nausea.   Jannifer Rodney, FNP

## 2020-06-22 NOTE — Progress Notes (Signed)
Attempted to connect to video several times. Pt never showed up. Will close out.   Jannifer Rodney, FNP

## 2021-05-24 ENCOUNTER — Ambulatory Visit: Payer: Medicaid Other | Admitting: Obstetrics & Gynecology

## 2021-06-07 ENCOUNTER — Telehealth: Payer: Self-pay | Admitting: Obstetrics & Gynecology

## 2021-06-07 NOTE — Telephone Encounter (Signed)
Called pt to schedule annual appt.  She was scheduled with Dr. Tiburcio Pea on 05/24/21, but the office was closed for a meeting.  Message was left letting the pt know that her appt would need to be rescheduled.  Left another message on the pt's voicemail.   ?

## 2023-03-08 ENCOUNTER — Observation Stay
Admission: EM | Admit: 2023-03-08 | Discharge: 2023-03-10 | Disposition: A | Discharge status: Home / Self Care | Payer: Medicaid Other | Attending: Osteopathic Medicine | Admitting: Osteopathic Medicine

## 2023-03-08 ENCOUNTER — Emergency Department: Payer: Medicaid Other

## 2023-03-08 ENCOUNTER — Encounter: Payer: Self-pay | Admitting: Emergency Medicine

## 2023-03-08 DIAGNOSIS — R162 Hepatomegaly with splenomegaly, not elsewhere classified: Secondary | ICD-10-CM | POA: Diagnosis not present

## 2023-03-08 DIAGNOSIS — R1013 Epigastric pain: Secondary | ICD-10-CM | POA: Diagnosis present

## 2023-03-08 DIAGNOSIS — R8290 Unspecified abnormal findings in urine: Secondary | ICD-10-CM | POA: Diagnosis not present

## 2023-03-08 DIAGNOSIS — Z79899 Other long term (current) drug therapy: Secondary | ICD-10-CM | POA: Insufficient documentation

## 2023-03-08 DIAGNOSIS — F1090 Alcohol use, unspecified, uncomplicated: Secondary | ICD-10-CM | POA: Diagnosis not present

## 2023-03-08 DIAGNOSIS — N390 Urinary tract infection, site not specified: Secondary | ICD-10-CM | POA: Diagnosis not present

## 2023-03-08 DIAGNOSIS — Z1152 Encounter for screening for COVID-19: Secondary | ICD-10-CM | POA: Diagnosis not present

## 2023-03-08 DIAGNOSIS — R000 Tachycardia, unspecified: Secondary | ICD-10-CM | POA: Insufficient documentation

## 2023-03-08 DIAGNOSIS — E039 Hypothyroidism, unspecified: Secondary | ICD-10-CM | POA: Diagnosis not present

## 2023-03-08 DIAGNOSIS — R112 Nausea with vomiting, unspecified: Secondary | ICD-10-CM | POA: Insufficient documentation

## 2023-03-08 DIAGNOSIS — F17210 Nicotine dependence, cigarettes, uncomplicated: Secondary | ICD-10-CM | POA: Insufficient documentation

## 2023-03-08 DIAGNOSIS — R739 Hyperglycemia, unspecified: Secondary | ICD-10-CM | POA: Diagnosis not present

## 2023-03-08 DIAGNOSIS — I700 Atherosclerosis of aorta: Secondary | ICD-10-CM | POA: Diagnosis not present

## 2023-03-08 DIAGNOSIS — K760 Fatty (change of) liver, not elsewhere classified: Secondary | ICD-10-CM | POA: Insufficient documentation

## 2023-03-08 DIAGNOSIS — R109 Unspecified abdominal pain: Secondary | ICD-10-CM | POA: Diagnosis present

## 2023-03-08 DIAGNOSIS — R1084 Generalized abdominal pain: Principal | ICD-10-CM

## 2023-03-08 DIAGNOSIS — R7309 Other abnormal glucose: Secondary | ICD-10-CM | POA: Diagnosis present

## 2023-03-08 LAB — CBC WITH DIFFERENTIAL/PLATELET
Abs Immature Granulocytes: 0.06 10*3/uL (ref 0.00–0.07)
Basophils Absolute: 0 10*3/uL (ref 0.0–0.1)
Basophils Relative: 0 %
Eosinophils Absolute: 0 10*3/uL (ref 0.0–0.5)
Eosinophils Relative: 0 %
HCT: 41 % (ref 36.0–46.0)
Hemoglobin: 13.2 g/dL (ref 12.0–15.0)
Immature Granulocytes: 0 %
Lymphocytes Relative: 8 %
Lymphs Abs: 1.2 10*3/uL (ref 0.7–4.0)
MCH: 25.5 pg — ABNORMAL LOW (ref 26.0–34.0)
MCHC: 32.2 g/dL (ref 30.0–36.0)
MCV: 79.3 fL — ABNORMAL LOW (ref 80.0–100.0)
Monocytes Absolute: 0.5 10*3/uL (ref 0.1–1.0)
Monocytes Relative: 3 %
Neutro Abs: 13.4 10*3/uL — ABNORMAL HIGH (ref 1.7–7.7)
Neutrophils Relative %: 89 %
Platelets: 226 10*3/uL (ref 150–400)
RBC: 5.17 MIL/uL — ABNORMAL HIGH (ref 3.87–5.11)
RDW: 14.4 % (ref 11.5–15.5)
WBC: 15.1 10*3/uL — ABNORMAL HIGH (ref 4.0–10.5)
nRBC: 0 % (ref 0.0–0.2)

## 2023-03-08 LAB — COMPREHENSIVE METABOLIC PANEL
ALT: 17 U/L (ref 0–44)
AST: 18 U/L (ref 15–41)
Albumin: 3.3 g/dL — ABNORMAL LOW (ref 3.5–5.0)
Alkaline Phosphatase: 108 U/L (ref 38–126)
Anion gap: 10 (ref 5–15)
BUN: 6 mg/dL (ref 6–20)
CO2: 22 mmol/L (ref 22–32)
Calcium: 8.5 mg/dL — ABNORMAL LOW (ref 8.9–10.3)
Chloride: 101 mmol/L (ref 98–111)
Creatinine, Ser: 0.49 mg/dL (ref 0.44–1.00)
GFR, Estimated: >60 mL/min (ref >60)
Glucose, Bld: 165 mg/dL — ABNORMAL HIGH (ref 70–99)
Potassium: 3.6 mmol/L (ref 3.5–5.1)
Sodium: 133 mmol/L — ABNORMAL LOW (ref 135–145)
Total Bilirubin: 0.6 mg/dL (ref <1.2)
Total Protein: 6.6 g/dL (ref 6.5–8.1)

## 2023-03-08 LAB — URINALYSIS, ROUTINE W REFLEX MICROSCOPIC
Bilirubin Urine: NEGATIVE
Glucose, UA: NEGATIVE mg/dL
Ketones, ur: NEGATIVE mg/dL
Nitrite: NEGATIVE
Protein, ur: NEGATIVE mg/dL
Specific Gravity, Urine: 1.012 (ref 1.005–1.030)
WBC, UA: >50 WBC/hpf (ref 0–5)
pH: 6 (ref 5.0–8.0)

## 2023-03-08 LAB — LIPASE, BLOOD: Lipase: 30 U/L (ref 11–51)

## 2023-03-08 LAB — RESP PANEL BY RT-PCR (RSV, FLU A&B, COVID)  RVPGX2
Influenza A by PCR: NEGATIVE
Influenza B by PCR: NEGATIVE
Resp Syncytial Virus by PCR: NEGATIVE
SARS Coronavirus 2 by RT PCR: NEGATIVE

## 2023-03-08 LAB — TROPONIN I (HIGH SENSITIVITY)
Troponin I (High Sensitivity): 7 ng/L (ref <18)
Troponin I (High Sensitivity): 8 ng/L (ref <18)

## 2023-03-08 LAB — LACTIC ACID, PLASMA: Lactic Acid, Venous: 1 mmol/L (ref 0.5–1.9)

## 2023-03-08 LAB — HCG, QUANTITATIVE, PREGNANCY: hCG, Beta Chain, Quant, S: 1 m[IU]/mL (ref <5)

## 2023-03-08 MED ORDER — SODIUM CHLORIDE 0.9 % IV SOLN
1.0000 g | Freq: Once | INTRAVENOUS | Status: AC
  Administered 2023-03-08: 1 g via INTRAVENOUS
  Filled 2023-03-08: qty 10

## 2023-03-08 MED ORDER — SODIUM CHLORIDE 0.9 % IV BOLUS
1000.0000 mL | Freq: Once | INTRAVENOUS | Status: AC
  Administered 2023-03-08: 1000 mL via INTRAVENOUS

## 2023-03-08 MED ORDER — MORPHINE SULFATE (PF) 4 MG/ML IV SOLN
4.0000 mg | INTRAVENOUS | Status: DC | PRN
  Administered 2023-03-08 – 2023-03-09 (×3): 4 mg via INTRAVENOUS
  Filled 2023-03-08 (×3): qty 1

## 2023-03-08 MED ORDER — ONDANSETRON HCL 4 MG/2ML IJ SOLN
4.0000 mg | Freq: Once | INTRAMUSCULAR | Status: AC
  Administered 2023-03-08: 4 mg via INTRAVENOUS
  Filled 2023-03-08: qty 2

## 2023-03-08 MED ORDER — IOHEXOL 300 MG/ML  SOLN
100.0000 mL | Freq: Once | INTRAMUSCULAR | Status: AC | PRN
  Administered 2023-03-08: 100 mL via INTRAVENOUS

## 2023-03-08 NOTE — ED Provider Notes (Signed)
[facility] Provider Note    Event Date/Time   First MD Initiated Contact with Patient 03/08/23 2040     (approximate)   History   Abdominal Pain   HPI  Kendra Clements is a 42 y.o. female presents to the ER for evaluation of acute epigastric pain radiating into the right groin.  Is never had pain like this before.  Started earlier in the day associated with some nausea and vomiting.  Does hurt worse with movement.  Denies any dysuria.  No vaginal bleeding.  Does not think that she is pregnant.  Last menstrual cycle was this past week.      Physical Exam   Triage Vital Signs: ED Triage Vitals  Encounter Vitals Group     BP 03/08/23 2032 (!) 140/83     Systolic BP Percentile --      Diastolic BP Percentile --      Pulse Rate 03/08/23 2032 (!) 123     Resp 03/08/23 2032 20     Temp 03/08/23 2032 98.2 F (36.8 C)     Temp Source 03/08/23 2032 Oral     SpO2 03/08/23 2032 96 %     Weight 03/08/23 2035 213 lb 13.5 oz (97 kg)     Height 03/08/23 2035 5\' 3"  (1.6 m)     Head Circumference --      Peak Flow --      Pain Score 03/08/23 2034 10     Pain Loc --      Pain Education --      Exclude from Growth Chart --     Most recent vital signs: Vitals:   03/08/23 2200 03/08/23 2215  BP: 116/63   Pulse: (!) 106 (!) 122  Resp: (!) 28 (!) 29  Temp:    SpO2:  93%     Constitutional: Alert  Eyes: Conjunctivae are normal.  Head: Atraumatic. Nose: No congestion/rhinnorhea. Mouth/Throat: Mucous membranes are moist.   Neck: Painless ROM.  Cardiovascular:   Good peripheral circulation. Respiratory: Normal respiratory effort.  No retractions.  Gastrointestinal: Diffuse tenderness to palpation on exam no rebound no guarding. Musculoskeletal:  no deformity Neurologic:  MAE spontaneously. No gross focal neurologic deficits are appreciated.  Skin:  Skin is warm, dry and intact. No rash noted. Psychiatric: Mood and affect are normal. Speech and  behavior are normal.    ED Results / Procedures / Treatments   Labs (all labs ordered are listed, but only abnormal results are displayed) Labs Reviewed  CBC WITH DIFFERENTIAL/PLATELET - Abnormal; Notable for the following components:      Result Value   WBC 15.1 (*)    RBC 5.17 (*)    MCV 79.3 (*)    MCH 25.5 (*)    Neutro Abs 13.4 (*)    All other components within normal limits  COMPREHENSIVE METABOLIC PANEL - Abnormal; Notable for the following components:   Sodium 133 (*)    Glucose, Bld 165 (*)    Calcium 8.5 (*)    Albumin 3.3 (*)    All other components within normal limits  URINALYSIS, ROUTINE W REFLEX MICROSCOPIC - Abnormal; Notable for the following components:   Color, Urine YELLOW (*)    APPearance HAZY (*)    Hgb urine dipstick MODERATE (*)    Leukocytes,Ua MODERATE (*)    Bacteria, UA RARE (*)    All other components within normal limits  RESP PANEL BY RT-PCR (RSV, FLU A&B, COVID)  [id number]  URINE CULTURE  LIPASE, BLOOD  HCG, QUANTITATIVE, PREGNANCY  LACTIC ACID, PLASMA  LACTIC ACID, PLASMA  TROPONIN I (HIGH SENSITIVITY)  TROPONIN I (HIGH SENSITIVITY)     EKG  ED ECG REPORT I, Willy Eddy, the attending physician, personally viewed and interpreted this ECG.   Date: 03/08/2023  EKG Time: 20:38  Rate: 120  Rhythm: sinus  Axis: normal  Intervals: normal  ST&T Change:  no stemi, no depressions    RADIOLOGY Please see ED Course for my review and interpretation.  I personally reviewed all radiographic images ordered to evaluate for the above acute complaints and reviewed radiology reports and findings.  These findings were personally discussed with the patient.  Please see medical record for radiology report.    PROCEDURES:  Critical Care performed:   Procedures   MEDICATIONS ORDERED IN ED: Medications  morphine (PF) 4 MG/ML injection 4 mg (4 mg Intravenous Given 03/08/23 2326)  sodium chloride 0.9 % bolus 1,000 mL (has no  administration in time range)  sodium chloride 0.9 % bolus 1,000 mL (0 mLs Intravenous Stopped 03/08/23 2226)  ondansetron (ZOFRAN) injection 4 mg (4 mg Intravenous Given 03/08/23 2123)  iohexol (OMNIPAQUE) 300 MG/ML solution 100 mL (100 mLs Intravenous Contrast Given 03/08/23 2239)  cefTRIAXone (ROCEPHIN) 1 g in sodium chloride 0.9 % 100 mL IVPB (1 g Intravenous New Bag/Given 03/08/23 2326)     IMPRESSION / MDM / ASSESSMENT AND PLAN / ED COURSE  I reviewed the triage vital signs and the nursing notes.                              Differential diagnosis includes, but is not limited to, enteritis, gastritis, biliary pathology, pancreatitis, appendicitis, obstruction, torsion, stone, doubt cardiac etiology dissection or PE.  Patient presenting to the ER for evaluation of symptoms as described above.  Based on symptoms, risk factors and considered above differential, this presenting complaint could reflect a potentially life-threatening illness therefore the patient will be placed on continuous pulse oximetry and telemetry for monitoring.  Laboratory evaluation will be sent to evaluate for the above complaints.      Clinical Course as of 03/09/23 [id number]  Wynelle Link Mar 08, 2023  2320 I called and discussed the case in consultation with radiology given her abdominal pain and grossly unremarkable CT imaging.  8 order appears normal.  Does not seem clinically consistent with dissection.  Have added on lactate given her persistent tachycardia.  Her pain is improved.  She primarily complaining of abdominal pain denies any chest pain at this time.  Urinalysis with many whites.  [PR]    Clinical Course User Index [PR] Willy Eddy, MD     FINAL CLINICAL IMPRESSION(S) / ED DIAGNOSES   Final diagnoses:  Generalized abdominal pain     Rx / DC Orders   ED Discharge Orders     None        Note:  This document was prepared using Dragon voice recognition software and may include  unintentional dictation errors.    Willy Eddy, MD 03/09/23 [id number]

## 2023-03-08 NOTE — ED Provider Notes (Incomplete)
[facility] Provider Note    Event Date/Time   First MD Initiated Contact with Patient 03/08/23 2040     (approximate)   History   Abdominal Pain   HPI  Kendra Clements is a 42 y.o. female presents to the ER for evaluation of acute epigastric pain radiating into the right groin.  Is never had pain like this before.  Started earlier in the day associated with some nausea and vomiting.  Does hurt worse with movement.  Denies any dysuria.  No vaginal bleeding.  Does not think that she is pregnant.  Last menstrual cycle was this past week.      Physical Exam   Triage Vital Signs: ED Triage Vitals  Encounter Vitals Group     BP 03/08/23 2032 (!) 140/83     Systolic BP Percentile --      Diastolic BP Percentile --      Pulse Rate 03/08/23 2032 (!) 123     Resp 03/08/23 2032 20     Temp 03/08/23 2032 98.2 F (36.8 C)     Temp Source 03/08/23 2032 Oral     SpO2 03/08/23 2032 96 %     Weight 03/08/23 2035 213 lb 13.5 oz (97 kg)     Height 03/08/23 2035 5\' 3"  (1.6 m)     Head Circumference --      Peak Flow --      Pain Score 03/08/23 2034 10     Pain Loc --      Pain Education --      Exclude from Growth Chart --     Most recent vital signs: Vitals:   03/08/23 2200 03/08/23 2215  BP: 116/63   Pulse: (!) 106 (!) 122  Resp: (!) 28 (!) 29  Temp:    SpO2:  93%     Constitutional: Alert  Eyes: Conjunctivae are normal.  Head: Atraumatic. Nose: No congestion/rhinnorhea. Mouth/Throat: Mucous membranes are moist.   Neck: Painless ROM.  Cardiovascular:   Good peripheral circulation. Respiratory: Normal respiratory effort.  No retractions.  Gastrointestinal: Diffuse tenderness to palpation on exam no rebound no guarding. Musculoskeletal:  no deformity Neurologic:  MAE spontaneously. No gross focal neurologic deficits are appreciated.  Skin:  Skin is warm, dry and intact. No rash noted. Psychiatric: Mood and affect are normal. Speech and  behavior are normal.    ED Results / Procedures / Treatments   Labs (all labs ordered are listed, but only abnormal results are displayed) Labs Reviewed  CBC WITH DIFFERENTIAL/PLATELET - Abnormal; Notable for the following components:      Result Value   WBC 15.1 (*)    RBC 5.17 (*)    MCV 79.3 (*)    MCH 25.5 (*)    Neutro Abs 13.4 (*)    All other components within normal limits  COMPREHENSIVE METABOLIC PANEL - Abnormal; Notable for the following components:   Sodium 133 (*)    Glucose, Bld 165 (*)    Calcium 8.5 (*)    Albumin 3.3 (*)    All other components within normal limits  URINALYSIS, ROUTINE W REFLEX MICROSCOPIC - Abnormal; Notable for the following components:   Color, Urine YELLOW (*)    APPearance HAZY (*)    Hgb urine dipstick MODERATE (*)    Leukocytes,Ua MODERATE (*)    Bacteria, UA RARE (*)    All other components within normal limits  RESP PANEL BY RT-PCR (RSV, FLU A&B, COVID)  RVPGX2  URINE CULTURE  LIPASE, BLOOD  HCG, QUANTITATIVE, PREGNANCY  LACTIC ACID, PLASMA  LACTIC ACID, PLASMA  TROPONIN I (HIGH SENSITIVITY)  TROPONIN I (HIGH SENSITIVITY)     EKG  ED ECG REPORT I, Willy Eddy, the attending physician, personally viewed and interpreted this ECG.   Date: 03/08/2023  EKG Time: 20:38  Rate: 120  Rhythm: sinus  Axis: normal  Intervals: normal  ST&T Change:  no stemi, no depressions    RADIOLOGY Please see ED Course for my review and interpretation.  I personally reviewed all radiographic images ordered to evaluate for the above acute complaints and reviewed radiology reports and findings.  These findings were personally discussed with the patient.  Please see medical record for radiology report.    PROCEDURES:  Critical Care performed:   Procedures   MEDICATIONS ORDERED IN ED: Medications  morphine (PF) 4 MG/ML injection 4 mg (4 mg Intravenous Given 03/08/23 2326)  cefTRIAXone (ROCEPHIN) 1 g in sodium chloride 0.9 %  100 mL IVPB (1 g Intravenous New Bag/Given 03/08/23 2326)  sodium chloride 0.9 % bolus 1,000 mL (0 mLs Intravenous Stopped 03/08/23 2226)  ondansetron (ZOFRAN) injection 4 mg (4 mg Intravenous Given 03/08/23 2123)  iohexol (OMNIPAQUE) 300 MG/ML solution 100 mL (100 mLs Intravenous Contrast Given 03/08/23 2239)     IMPRESSION / MDM / ASSESSMENT AND PLAN / ED COURSE  I reviewed the triage vital signs and the nursing notes.                              Differential diagnosis includes, but is not limited to, enteritis, gastritis, biliary pathology, pancreatitis, appendicitis, obstruction, torsion, stone, doubt cardiac etiology dissection or PE.  Patient presenting to the ER for evaluation of symptoms as described above.  Based on symptoms, risk factors and considered above differential, this presenting complaint could reflect a potentially life-threatening illness therefore the patient will be placed on continuous pulse oximetry and telemetry for monitoring.  Laboratory evaluation will be sent to evaluate for the above complaints.      Clinical Course as of 03/08/23 2330  Wynelle Link Mar 08, 2023  2320 I called and discussed the case in consultation with radiology given her abdominal pain and grossly unremarkable CT imaging.  8 order appears normal.  Does not seem clinically consistent with dissection.  Have added on lactate given her persistent tachycardia.  Her pain is improved.  She primarily complaining of abdominal pain denies any chest pain at this time.  Urinalysis with many whites.  [PR]    Clinical Course User Index [PR] Willy Eddy, MD     FINAL CLINICAL IMPRESSION(S) / ED DIAGNOSES   Final diagnoses:  None     Rx / DC Orders   ED Discharge Orders     None        Note:  This document was prepared using Dragon voice recognition software and may include unintentional dictation errors.

## 2023-03-08 NOTE — ED Triage Notes (Signed)
Pt c/o upper epigastric pain that radiates around bilateral ribs and down into pts groin. Pt reports symptoms since this AM without N/V/D. Pt noticed heavy menstruation this AM but no vaginal bleeding or UA symptoms to follow.

## 2023-03-09 ENCOUNTER — Observation Stay: Payer: Medicaid Other

## 2023-03-09 DIAGNOSIS — R109 Unspecified abdominal pain: Secondary | ICD-10-CM | POA: Diagnosis present

## 2023-03-09 DIAGNOSIS — R1084 Generalized abdominal pain: Secondary | ICD-10-CM

## 2023-03-09 DIAGNOSIS — R7309 Other abnormal glucose: Secondary | ICD-10-CM | POA: Diagnosis present

## 2023-03-09 DIAGNOSIS — R1013 Epigastric pain: Secondary | ICD-10-CM

## 2023-03-09 DIAGNOSIS — R8290 Unspecified abnormal findings in urine: Secondary | ICD-10-CM | POA: Diagnosis present

## 2023-03-09 LAB — CBC
HCT: 36.3 % (ref 36.0–46.0)
Hemoglobin: 11.5 g/dL — ABNORMAL LOW (ref 12.0–15.0)
MCH: 25.4 pg — ABNORMAL LOW (ref 26.0–34.0)
MCHC: 31.7 g/dL (ref 30.0–36.0)
MCV: 80.3 fL (ref 80.0–100.0)
Platelets: 212 10*3/uL (ref 150–400)
RBC: 4.52 MIL/uL (ref 3.87–5.11)
RDW: 14.6 % (ref 11.5–15.5)
WBC: 16.3 10*3/uL — ABNORMAL HIGH (ref 4.0–10.5)
nRBC: 0 % (ref 0.0–0.2)

## 2023-03-09 LAB — URINE DRUG SCREEN, QUALITATIVE (ARMC ONLY)
Amphetamines, Ur Screen: POSITIVE — AB
Barbiturates, Ur Screen: NOT DETECTED
Benzodiazepine, Ur Scrn: NOT DETECTED
Cannabinoid 50 Ng, Ur ~~LOC~~: NOT DETECTED
Cocaine Metabolite,Ur ~~LOC~~: NOT DETECTED
MDMA (Ecstasy)Ur Screen: NOT DETECTED
Methadone Scn, Ur: NOT DETECTED
Opiate, Ur Screen: POSITIVE — AB
Phencyclidine (PCP) Ur S: NOT DETECTED
Tricyclic, Ur Screen: NOT DETECTED

## 2023-03-09 LAB — T4, FREE: Free T4: 2.94 ng/dL — ABNORMAL HIGH (ref 0.61–1.12)

## 2023-03-09 LAB — HIV ANTIBODY (ROUTINE TESTING W REFLEX): HIV Screen 4th Generation wRfx: NONREACTIVE

## 2023-03-09 LAB — TSH: TSH: <0.01 u[IU]/mL — ABNORMAL LOW (ref 0.350–4.500)

## 2023-03-09 LAB — HEMOGLOBIN A1C
Hgb A1c MFr Bld: 6 % — ABNORMAL HIGH (ref 4.8–5.6)
Mean Plasma Glucose: 125.5 mg/dL

## 2023-03-09 LAB — LACTIC ACID, PLASMA: Lactic Acid, Venous: 1 mmol/L (ref 0.5–1.9)

## 2023-03-09 LAB — MAGNESIUM: Magnesium: 1.5 mg/dL — ABNORMAL LOW (ref 1.7–2.4)

## 2023-03-09 LAB — CREATININE, SERUM
Creatinine, Ser: 0.44 mg/dL (ref 0.44–1.00)
GFR, Estimated: >60 mL/min (ref >60)

## 2023-03-09 LAB — CBG MONITORING, ED: Glucose-Capillary: 110 mg/dL — ABNORMAL HIGH (ref 70–99)

## 2023-03-09 MED ORDER — MORPHINE SULFATE (PF) 4 MG/ML IV SOLN: 4.0000 mg | INTRAVENOUS | Status: DC | PRN

## 2023-03-09 MED ORDER — SODIUM CHLORIDE 0.9% FLUSH
3.0000 mL | Freq: Two times a day (BID) | INTRAVENOUS | Status: DC
  Administered 2023-03-09 (×3): 3 mL via INTRAVENOUS

## 2023-03-09 MED ORDER — LACTATED RINGERS IV SOLN: Freq: Once | INTRAVENOUS | Status: AC

## 2023-03-09 MED ORDER — INSULIN ASPART 100 UNIT/ML IJ SOLN: 0.0000 [IU] | Freq: Every day | INTRAMUSCULAR | Status: DC

## 2023-03-09 MED ORDER — ONDANSETRON HCL 4 MG/2ML IJ SOLN
4.0000 mg | Freq: Four times a day (QID) | INTRAMUSCULAR | Status: DC | PRN
  Administered 2023-03-09: 4 mg via INTRAVENOUS
  Filled 2023-03-09: qty 2

## 2023-03-09 MED ORDER — SODIUM CHLORIDE 0.9 % IV BOLUS
1000.0000 mL | Freq: Once | INTRAVENOUS | Status: AC
  Administered 2023-03-09: 1000 mL via INTRAVENOUS

## 2023-03-09 MED ORDER — SODIUM CHLORIDE 0.9 % IV SOLN
1.0000 g | INTRAVENOUS | Status: DC
  Administered 2023-03-09: 1 g via INTRAVENOUS
  Filled 2023-03-09: qty 10

## 2023-03-09 MED ORDER — OXYCODONE HCL 5 MG PO TABS: 5.0000 mg | ORAL_TABLET | ORAL | Status: DC | PRN

## 2023-03-09 MED ORDER — BISACODYL 5 MG PO TBEC: 5.0000 mg | DELAYED_RELEASE_TABLET | Freq: Every day | ORAL | Status: DC | PRN

## 2023-03-09 MED ORDER — ALBUTEROL SULFATE (2.5 MG/3ML) 0.083% IN NEBU: 2.5000 mg | INHALATION_SOLUTION | RESPIRATORY_TRACT | Status: DC | PRN

## 2023-03-09 MED ORDER — IOHEXOL 350 MG/ML SOLN
100.0000 mL | Freq: Once | INTRAVENOUS | Status: AC | PRN
  Administered 2023-03-09: 100 mL via INTRAVENOUS

## 2023-03-09 MED ORDER — HYDRALAZINE HCL 20 MG/ML IJ SOLN: 5.0000 mg | INTRAMUSCULAR | Status: DC | PRN

## 2023-03-09 MED ORDER — MORPHINE SULFATE (PF) 4 MG/ML IV SOLN
4.0000 mg | INTRAVENOUS | Status: DC | PRN
  Administered 2023-03-09: 4 mg via INTRAVENOUS
  Filled 2023-03-09: qty 1

## 2023-03-09 MED ORDER — HEPARIN SODIUM (PORCINE) 5000 UNIT/ML IJ SOLN
5000.0000 [IU] | Freq: Three times a day (TID) | INTRAMUSCULAR | Status: DC
  Administered 2023-03-09 – 2023-03-10 (×4): 5000 [IU] via SUBCUTANEOUS
  Filled 2023-03-09 (×4): qty 1

## 2023-03-09 MED ORDER — ONDANSETRON HCL 4 MG PO TABS: 4.0000 mg | ORAL_TABLET | Freq: Four times a day (QID) | ORAL | Status: DC | PRN

## 2023-03-09 MED ORDER — ACETAMINOPHEN 650 MG RE SUPP: 650.0000 mg | Freq: Four times a day (QID) | RECTAL | Status: DC | PRN

## 2023-03-09 MED ORDER — INSULIN ASPART 100 UNIT/ML IJ SOLN: 0.0000 [IU] | Freq: Three times a day (TID) | INTRAMUSCULAR | Status: DC

## 2023-03-09 MED ORDER — POLYETHYLENE GLYCOL 3350 17 G PO PACK: 17.0000 g | PACK | Freq: Every day | ORAL | Status: DC | PRN

## 2023-03-09 MED ORDER — NICOTINE 14 MG/24HR TD PT24
14.0000 mg | MEDICATED_PATCH | Freq: Every day | TRANSDERMAL | Status: DC
  Administered 2023-03-09: 14 mg via TRANSDERMAL
  Filled 2023-03-09: qty 1

## 2023-03-09 MED ORDER — DOCUSATE SODIUM 100 MG PO CAPS
100.0000 mg | ORAL_CAPSULE | Freq: Two times a day (BID) | ORAL | Status: DC
  Administered 2023-03-09 (×2): 100 mg via ORAL
  Filled 2023-03-09 (×3): qty 1

## 2023-03-09 MED ORDER — ACETAMINOPHEN 325 MG PO TABS: 650.0000 mg | ORAL_TABLET | Freq: Four times a day (QID) | ORAL | Status: DC | PRN

## 2023-03-09 NOTE — TOC CM/SW Note (Signed)
Transition of Care [facility]) - Inpatient Brief Assessment   Patient Details  Name: Kendra Clements MRN: [id number] Date of Birth: 1980/10/23  [facility]) CM/SW Contact:    Margarito Liner, LCSW Phone Number: 03/09/2023, 12:13 PM   Clinical Narrative: CSW acknowledges consult for home health/DME needs. PT evaluated and determined no follow up/DME needs. OT screened out. CSW reviewed chart. No TOC needs identified. CSW will continue to follow progress. Please place another [facility] consult if any needs arise.  Transition of Care Asessment: Insurance and Status: Insurance coverage has been reviewed Patient has primary care physician: Yes Home environment has been reviewed: Mobile home Prior level of function:: Independent Prior/Current Home Services: No current home services Social Drivers of Health Review: SDOH reviewed no interventions necessary Readmission risk has been reviewed: Yes Transition of care needs: no transition of care needs at this time

## 2023-03-09 NOTE — ED Notes (Signed)
Patient transported to CT 

## 2023-03-09 NOTE — Evaluation (Signed)
Physical Therapy Evaluation Patient Details Name: Kendra Clements MRN: [id number] DOB: 1980/08/14 Today's Date: 03/09/2023  History of Present Illness  Pt is a 42 y/o F admitted on 03/08/23 after presenting with c/o abdominal pain. Pt found to have abnormal urinalysis. PMH: migraines, hypothyroidism  Clinical Impression  Pt seen for PT evaluation with pt agreeable to tx, boyfriend present for session. Pt reports prior to admission she was independent without AD, driving. On this date, pt endorses significant abdominal pain, even tender to touch. PT educates pt on log rolling & using pillow for compression when coughing/sneezing to help reduce pain. Pt is able to ambulate around room without AD with mod I. PT did provide RW for pain management with pt reporting it slightly helps. Pt feels close to baseline, if not for abdominal pain. At this time, pt does not require acute PT services. PT to complete current orders. Please re-consult if new needs arise.        If plan is discharge home, recommend the following: Assistance with cooking/housework   Can travel by private vehicle        Equipment Recommendations None recommended by PT  Recommendations for Other Services       Functional Status Assessment Patient has not had a recent decline in their functional status     Precautions / Restrictions Precautions Precautions: None Restrictions Weight Bearing Restrictions Per Provider Order: No      Mobility  Bed Mobility Overal bed mobility: Modified Independent             General bed mobility comments: supine<>sit from ED stretcher with HOB slightly elevated    Transfers Overall transfer level: Independent Equipment used: None                    Ambulation/Gait Ambulation/Gait assistance: Independent Gait Distance (Feet): 25 Feet (+ 25 ft) Assistive device: None, Rolling walker (2 wheels) Gait Pattern/deviations: Decreased stride length Gait velocity:  decreased     General Gait Details: Pt ambulates around room without AD with independence, provided RW for pain management with pt noting it slightly helps.  Stairs            Wheelchair Mobility     Tilt Bed    Modified Rankin (Stroke Patients Only)       Balance Overall balance assessment: Modified Independent                                           Pertinent Vitals/Pain Pain Assessment Pain Assessment: 0-10 Pain Score: 10-Worst pain ever Pain Location: abdomen Pain Descriptors / Indicators: Grimacing, Discomfort, Guarding Pain Intervention(s): Monitored during session, Limited activity within patient's tolerance, Repositioned    Home Living Family/patient expects to be discharged to:: Private residence Living Arrangements: Spouse/significant other Available Help at Discharge: Family Type of Home: Mobile home Home Access: Stairs to enter Entrance Stairs-Rails: Right;Left;Can reach both Entrance Stairs-Number of Steps: 6   Home Layout: One level   Additional Comments: pt has access to RW, shower chair    Prior Function Prior Level of Function : Independent/Modified Independent;Working/employed;Driving             Mobility Comments: 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), independent without AD       Extremity/Trunk Assessment   Upper Extremity Assessment Upper Extremity Assessment: Overall WFL for tasks assessed    Lower Extremity Assessment Lower Extremity  Assessment: Overall WFL for tasks assessed       Communication   Communication Communication: No apparent difficulties  Cognition Arousal: Alert Behavior During Therapy: WFL for tasks assessed/performed Overall Cognitive Status: Within Functional Limits for tasks assessed                                          General Comments General comments (skin integrity, edema, etc.): PT educated pt on log rolling with pt able to return demonstrate with mod I. PT also  educated pt on use of pillow to provide abdominal compression for increased comfort with coughing, sneezing.    Exercises     Assessment/Plan    PT Assessment Patient does not need any further PT services  PT Problem List         PT Treatment Interventions      PT Goals (Current goals can be found in the Care Plan section)  Acute Rehab PT Goals Patient Stated Goal: decreased pain PT Goal Formulation: With patient Time For Goal Achievement: 03/23/23 Potential to Achieve Goals: Good    Frequency       Co-evaluation               AM-PAC PT "6 Clicks" Mobility  Outcome Measure Help needed turning from your back to your side while in a flat bed without using bedrails?: None Help needed moving from lying on your back to sitting on the side of a flat bed without using bedrails?: None Help needed moving to and from a bed to a chair (including a wheelchair)?: None Help needed standing up from a chair using your arms (e.g., wheelchair or bedside chair)?: None Help needed to walk in hospital room?: None Help needed climbing 3-5 steps with a railing? : None 6 Click Score: 24    End of Session   Activity Tolerance: Patient limited by pain Patient left: in bed;with call bell/phone within reach;with family/visitor present Nurse Communication: Mobility status PT Visit Diagnosis: Pain Pain - part of body:  (abdomen)    Time: 0865-7846 PT Time Calculation (min) (ACUTE ONLY): 12 min   Charges:   PT Evaluation $PT Eval Low Complexity: 1 Low   PT General Charges $$ ACUTE PT VISIT: 1 Visit         Aleda Grana, PT, DPT 03/09/23, 9:55 AM   Sandi Mariscal 03/09/2023, 9:53 AM

## 2023-03-09 NOTE — ED Notes (Signed)
Patient assisted to ambulate to bathroom.  Steady gait noted

## 2023-03-09 NOTE — H&P (Signed)
History and Physical    Patient: Kendra Clements YQM:[id number] DOB: 04/24/1980 DOA: 03/08/2023 DOS: the patient was seen and examined on 03/09/2023 PCP: Evelene Croon, MD  Patient coming from: Home  Chief Complaint  Patient presents with   Abdominal Pain    HPI: Kendra Clements is a 42 y.o. female with past medical history  of tramadol allergy, history of migraines and hypothyroidism presenting with abd pain going from epigastric to bilateral ribs radiating down into the groin per report patient noticed heavy menstruation this morning and no reports of any dysuria.   In emergency room vitals trend shows: Vitals:   03/08/23 2345 03/08/23 2359 03/09/23 0000 03/09/23 0015  BP: (!) 107/55  (!) 107/49   Pulse: (!) 117  (!) 114 (!) 114  Temp:  98.9 F (37.2 C)    Resp: (!) 33  (!) 32 (!) 36  Height:      Weight:      SpO2: 92%   93%  TempSrc:  Oral    BMI (Calculated):      Vitals show tachycardia with a heart rate of 123 respirations of 20 O2 sats 96% on room air. EKG shows sinus tachycardia at 120 no ST-T wave changes. CT of the abdomen is negative for any acute renal or intra-abdominal pathology.  Labs are notable for : Metabolic panel showing glucose 165 normal kidney function normal LFTs sodium 133. Troponin of 7 and 8. Lactic acid of 1.0 CBC shows a white count of 15.1 hemoglobin of 13.2 and CBC is within normal limits. Viral panel negative for influenza RSV and COVID.  In the ED pt received: Medications  morphine (PF) 4 MG/ML injection 4 mg (4 mg Intravenous Given 03/08/23 2326)  cefTRIAXone (ROCEPHIN) 1 g in sodium chloride 0.9 % 100 mL IVPB (has no administration in time range)  sodium chloride 0.9 % bolus 1,000 mL (0 mLs Intravenous Stopped 03/08/23 2226)  ondansetron (ZOFRAN) injection 4 mg (4 mg Intravenous Given 03/08/23 2123)  iohexol (OMNIPAQUE) 300 MG/ML solution 100 mL (100 mLs Intravenous Contrast Given 03/08/23 2239)  cefTRIAXone  (ROCEPHIN) 1 g in sodium chloride 0.9 % 100 mL IVPB (0 g Intravenous Stopped 03/09/23 0017)  sodium chloride 0.9 % bolus 1,000 mL (1,000 mLs Intravenous New Bag/Given 03/09/23 0107)    Review of Systems  Gastrointestinal:  Positive for abdominal pain, nausea and vomiting.   Past Medical History:  Diagnosis Date   Family history of ovarian cancer 09/2017   cancer genetic testing letter sent   Migraines    Thyroid disease    Past Surgical History:  Procedure Laterality Date   CESAREAN SECTION     MOUTH SURGERY     SHOULDER ARTHROSCOPY     SHOULDER SURGERY      reports that she has been smoking cigarettes. She has never used smokeless tobacco. She reports current alcohol use. She reports that she does not use drugs.  Allergies  Allergen Reactions   Tramadol Nausea And Vomiting    Family History  Problem Relation Age of Onset   Hypertension Mother    Ovarian cancer Mother 67   Hypothyroidism Maternal Grandmother    COPD Paternal Grandmother    Breast cancer Cousin 13   Ovarian cancer Cousin 40   COPD Cousin    Hypothyroidism Maternal Uncle     Prior to Admission medications   Medication Sig Start Date End Date Taking? Authorizing Provider  meclizine (ANTIVERT) 25 MG tablet Take 1 tablet (25 mg total)  by mouth 3 (three) times daily as needed for dizziness. Patient not taking: Reported on 09/29/2017 09/24/17   Phineas Semen, MD  ondansetron (ZOFRAN) 4 MG tablet Take 1 tablet (4 mg total) by mouth every 8 (eight) hours as needed for nausea or vomiting. 06/22/20   Junie Spencer, FNP  phentermine (ADIPEX-P) 37.5 MG tablet Take 1 tablet (37.5 mg total) by mouth daily before breakfast. 03/12/20   Conard Novak, MD  SUMAtriptan (IMITREX) 50 MG tablet Take 1 tablet (50 mg total) by mouth every 2 (two) hours as needed for migraine. May repeat in 2 hours if headache persists or recurs. 06/22/20   Junie Spencer, FNP     Vitals:   03/08/23 2345 03/08/23 2359 03/09/23 0000  03/09/23 0015  BP: (!) 107/55  (!) 107/49   Pulse: (!) 117  (!) 114 (!) 114  Resp: (!) 33  (!) 32 (!) 36  Temp:  98.9 F (37.2 C)    TempSrc:  Oral    SpO2: 92%   93%  Weight:      Height:       Physical Exam Vitals and nursing note reviewed.  Constitutional:      General: She is not in acute distress. HENT:     Head: Normocephalic and atraumatic.     Right Ear: Hearing normal.     Left Ear: Hearing normal.     Nose: Nose normal. No nasal deformity.     Mouth/Throat:     Lips: Pink.     Tongue: No lesions.     Pharynx: Oropharynx is clear.  Eyes:     General: Lids are normal.     Extraocular Movements: Extraocular movements intact.  Cardiovascular:     Rate and Rhythm: Normal rate and regular rhythm.     Heart sounds: Normal heart sounds.  Pulmonary:     Effort: Pulmonary effort is normal.     Breath sounds: Normal breath sounds.  Abdominal:     General: Bowel sounds are normal. There is no distension.     Palpations: Abdomen is soft. There is no mass.     Tenderness: There is no abdominal tenderness.  Genitourinary:    Rectum: Tenderness present.  Musculoskeletal:     Right lower leg: No edema.     Left lower leg: No edema.  Skin:    General: Skin is warm.  Neurological:     General: No focal deficit present.     Mental Status: She is alert and oriented to person, place, and time.     Cranial Nerves: Cranial nerves 2-12 are intact.  Psychiatric:        Attention and Perception: Attention normal.        Mood and Affect: Mood normal.        Speech: Speech normal.        Behavior: Behavior normal. Behavior is cooperative.       Labs on Admission: I have personally reviewed following labs and imaging studies Results for orders placed or performed during the hospital encounter of 03/08/23 (from the past 24 hours)  CBC with Differential     Status: Abnormal   Collection Time: 03/08/23  9:08 PM  Result Value Ref Range   WBC 15.1 (H) 4.0 - 10.5 K/uL   RBC 5.17  (H) 3.87 - 5.11 MIL/uL   Hemoglobin 13.2 12.0 - 15.0 g/dL   HCT 84.6 96.2 - 95.2 %   MCV 79.3 (L) 80.0 - 100.0 fL  MCH 25.5 (L) 26.0 - 34.0 pg   MCHC 32.2 30.0 - 36.0 g/dL   RDW 16.1 09.6 - 04.5 %   Platelets 226 150 - 400 K/uL   nRBC 0.0 0.0 - 0.2 %   Neutrophils Relative % 89 %   Neutro Abs 13.4 (H) 1.7 - 7.7 K/uL   Lymphocytes Relative 8 %   Lymphs Abs 1.2 0.7 - 4.0 K/uL   Monocytes Relative 3 %   Monocytes Absolute 0.5 0.1 - 1.0 K/uL   Eosinophils Relative 0 %   Eosinophils Absolute 0.0 0.0 - 0.5 K/uL   Basophils Relative 0 %   Basophils Absolute 0.0 0.0 - 0.1 K/uL   Immature Granulocytes 0 %   Abs Immature Granulocytes 0.06 0.00 - 0.07 K/uL  Comprehensive metabolic panel     Status: Abnormal   Collection Time: 03/08/23  9:08 PM  Result Value Ref Range   Sodium 133 (L) 135 - 145 mmol/L   Potassium 3.6 3.5 - 5.1 mmol/L   Chloride 101 98 - 111 mmol/L   CO2 22 22 - 32 mmol/L   Glucose, Bld 165 (H) 70 - 99 mg/dL   BUN 6 6 - 20 mg/dL   Creatinine, Ser 4.09 0.44 - 1.00 mg/dL   Calcium 8.5 (L) 8.9 - 10.3 mg/dL   Total Protein 6.6 6.5 - 8.1 g/dL   Albumin 3.3 (L) 3.5 - 5.0 g/dL   AST 18 15 - 41 U/L   ALT 17 0 - 44 U/L   Alkaline Phosphatase 108 38 - 126 U/L   Total Bilirubin 0.6 <1.2 mg/dL   GFR, Estimated >81 >19 mL/min   Anion gap 10 5 - 15  Lipase, blood     Status: None   Collection Time: 03/08/23  9:08 PM  Result Value Ref Range   Lipase 30 11 - 51 U/L  Troponin I (High Sensitivity)     Status: None   Collection Time: 03/08/23  9:08 PM  Result Value Ref Range   Troponin I (High Sensitivity) 7 <18 ng/L  Resp panel by RT-PCR (RSV, Flu A&B, Covid) Anterior Nasal Swab     Status: None   Collection Time: 03/08/23  9:08 PM   Specimen: Anterior Nasal Swab  Result Value Ref Range   SARS Coronavirus 2 by RT PCR NEGATIVE NEGATIVE   Influenza A by PCR NEGATIVE NEGATIVE   Influenza B by PCR NEGATIVE NEGATIVE   Resp Syncytial Virus by PCR NEGATIVE NEGATIVE  hCG,  quantitative, pregnancy     Status: None   Collection Time: 03/08/23  9:08 PM  Result Value Ref Range   hCG, Beta Chain, Quant, S 1 <5 mIU/mL  Troponin I (High Sensitivity)     Status: None   Collection Time: 03/08/23 10:29 PM  Result Value Ref Range   Troponin I (High Sensitivity) 8 <18 ng/L  Urinalysis, Routine w reflex microscopic -Urine, Clean Catch     Status: Abnormal   Collection Time: 03/08/23 10:29 PM  Result Value Ref Range   Color, Urine YELLOW (A) YELLOW   APPearance HAZY (A) CLEAR   Specific Gravity, Urine 1.012 1.005 - 1.030   pH 6.0 5.0 - 8.0   Glucose, UA NEGATIVE NEGATIVE mg/dL   Hgb urine dipstick MODERATE (A) NEGATIVE   Bilirubin Urine NEGATIVE NEGATIVE   Ketones, ur NEGATIVE NEGATIVE mg/dL   Protein, ur NEGATIVE NEGATIVE mg/dL   Nitrite NEGATIVE NEGATIVE   Leukocytes,Ua MODERATE (A) NEGATIVE   RBC / HPF 11-20 0 -  5 RBC/hpf   WBC, UA >50 0 - 5 WBC/hpf   Bacteria, UA RARE (A) NONE SEEN   Squamous Epithelial / HPF 0-5 0 - 5 /HPF   Mucus PRESENT   Lactic acid, plasma     Status: None   Collection Time: 03/08/23 11:27 PM  Result Value Ref Range   Lactic Acid, Venous 1.0 0.5 - 1.9 mmol/L    CBC:    Latest Ref Rng & Units 03/08/2023    9:08 PM 01/11/2018    5:26 PM 09/24/2017    5:09 PM  CBC  WBC 4.0 - 10.5 K/uL 15.1  8.6  9.3   Hemoglobin 12.0 - 15.0 g/dL 78.4  69.6  29.5   Hematocrit 36.0 - 46.0 % 41.0  41.8  39.8   Platelets 150 - 400 K/uL 226  244  224    Basic Metabolic Panel: Recent Labs  Lab 03/08/23 2108  NA 133*  K 3.6  CL 101  CO2 22  GLUCOSE 165*  BUN 6  CREATININE 0.49  CALCIUM 8.5*   GFR: Lab Results  Component Value Date   CREATININE 0.49 03/08/2023   CREATININE 0.41 (L) 01/11/2018   CREATININE <0.30 (L) 09/24/2017   Estimated Creatinine Clearance: 103.6 mL/min (by C-G formula based on SCr of 0.49 mg/dL). Liver Function Tests:    Latest Ref Rng & Units 03/08/2023    9:08 PM  Hepatic Function  Total Protein 6.5 - 8.1 g/dL  6.6   Albumin 3.5 - 5.0 g/dL 3.3   AST 15 - 41 U/L 18   ALT 0 - 44 U/L 17   Alk Phosphatase 38 - 126 U/L 108   Total Bilirubin <1.2 mg/dL 0.6    Coagulation Profile: No results for input(s): "INR", "PROTIME" in the last 168 hours. Cardiac Enzymes: No results for input(s): "CKTOTAL", "CKMB", "CKMBINDEX", "TROPONINI" in the last 168 hours. BNP (last 3 results) No results for input(s): "PROBNP" in the last 8760 hours. HbA1C: No results for input(s): "HGBA1C" in the last 72 hours. CBG: No results for input(s): "GLUCAP" in the last 168 hours. Lipid Profile: No results for input(s): "CHOL", "HDL", "LDLCALC", "TRIG", "CHOLHDL", "LDLDIRECT" in the last 72 hours. Urinalysis    Component Value Date/Time   COLORURINE YELLOW (A) 03/08/2023 2229   APPEARANCEUR HAZY (A) 03/08/2023 2229   LABSPEC 1.012 03/08/2023 2229   PHURINE 6.0 03/08/2023 2229   GLUCOSEU NEGATIVE 03/08/2023 2229   HGBUR MODERATE (A) 03/08/2023 2229   BILIRUBINUR NEGATIVE 03/08/2023 2229   KETONESUR NEGATIVE 03/08/2023 2229   PROTEINUR NEGATIVE 03/08/2023 2229   NITRITE NEGATIVE 03/08/2023 2229   LEUKOCYTESUR MODERATE (A) 03/08/2023 2229   Recent Results (from the past 720 hours)  Resp panel by RT-PCR (RSV, Flu A&B, Covid) Anterior Nasal Swab     Status: None   Collection Time: 03/08/23  9:08 PM   Specimen: Anterior Nasal Swab  Result Value Ref Range Status   SARS Coronavirus 2 by RT PCR NEGATIVE NEGATIVE Final    Comment: (NOTE) SARS-CoV-2 target nucleic acids are NOT DETECTED.  The SARS-CoV-2 RNA is generally detectable in upper respiratory specimens during the acute phase of infection. The lowest concentration of SARS-CoV-2 viral copies this assay can detect is 138 copies/mL. A negative result does not preclude SARS-Cov-2 infection and should not be used as the sole basis for treatment or other patient management decisions. A negative result may occur with  improper specimen collection/handling, submission of  specimen other than nasopharyngeal swab, presence of viral  mutation(s) within the areas targeted by this assay, and inadequate number of viral copies(<138 copies/mL). A negative result must be combined with clinical observations, patient history, and epidemiological information. The expected result is Negative.  Fact Sheet for Patients:  BloggerCourse.com  Fact Sheet for Healthcare Providers:  SeriousBroker.it  This test is no t yet approved or cleared by the [place] FDA and  has been authorized for detection and/or diagnosis of SARS-CoV-2 by FDA under an Emergency Use Authorization (EUA). This EUA will remain  in effect (meaning this test can be used) for the duration of the COVID-19 declaration under Section 564(b)(1) of the Act, 21 U.S.C.section 360bbb-3(b)(1), unless the authorization is terminated  or revoked sooner.       Influenza A by PCR NEGATIVE NEGATIVE Final   Influenza B by PCR NEGATIVE NEGATIVE Final    Comment: (NOTE) The Xpert Xpress SARS-CoV-2/FLU/RSV plus assay is intended as an aid in the diagnosis of influenza from Nasopharyngeal swab specimens and should not be used as a sole basis for treatment. Nasal washings and aspirates are unacceptable for Xpert Xpress SARS-CoV-2/FLU/RSV testing.  Fact Sheet for Patients: BloggerCourse.com  Fact Sheet for Healthcare Providers: SeriousBroker.it  This test is not yet approved or cleared by the [place] FDA and has been authorized for detection and/or diagnosis of SARS-CoV-2 by FDA under an Emergency Use Authorization (EUA). This EUA will remain in effect (meaning this test can be used) for the duration of the COVID-19 declaration under Section 564(b)(1) of the Act, 21 U.S.C. section 360bbb-3(b)(1), unless the authorization is terminated or revoked.     Resp Syncytial Virus by PCR NEGATIVE NEGATIVE Final     Comment: (NOTE) Fact Sheet for Patients: BloggerCourse.com  Fact Sheet for Healthcare Providers: SeriousBroker.it  This test is not yet approved or cleared by the [place] FDA and has been authorized for detection and/or diagnosis of SARS-CoV-2 by FDA under an Emergency Use Authorization (EUA). This EUA will remain in effect (meaning this test can be used) for the duration of the COVID-19 declaration under Section 564(b)(1) of the Act, 21 U.S.C. section 360bbb-3(b)(1), unless the authorization is terminated or revoked.  Performed at [facility], [address] [place], [place] [address]     Paralee Cancel (From admission, onward)     Start     Ordered   03/08/23 2321  Lactic acid, plasma  (Lactic Acid)  Now then every 2 hours,   STAT      03/08/23 2320   03/08/23 2305  Urine Culture  Once,   URGENT       Question:  Indication  Answer:  Sepsis   03/08/23 2305            Radiological Exams on Admission: CT ABDOMEN PELVIS W CONTRAST Result Date: 03/08/2023 CLINICAL DATA:  Acute nonlocalized abdominal pain. Upper epigastric pain radiating to the ribs and groin. Symptoms since this morning. EXAM: CT ABDOMEN AND PELVIS WITH CONTRAST TECHNIQUE: Multidetector CT imaging of the abdomen and pelvis was performed using the standard protocol following bolus administration of intravenous contrast. RADIATION DOSE REDUCTION: This exam was performed according to the departmental dose-optimization program which includes automated exposure control, adjustment of the mA and/or kV according to patient size and/or use of iterative reconstruction technique. CONTRAST:  OMNIPAQUE IOHEXOL 300 MG/ML  SOLN COMPARISON:  None Available. FINDINGS: Lower chest: Lung bases are clear. Hepatobiliary: No focal liver abnormality is seen. No gallstones, gallbladder wall thickening, or biliary dilatation. Pancreas: Unremarkable. No  pancreatic  ductal dilatation or surrounding inflammatory changes. Spleen: Normal in size without focal abnormality. Adrenals/Urinary Tract: No adrenal gland nodules. Homogeneous and symmetrical nephrograms. Subcentimeter cysts in the right kidney. No imaging follow-up indicated. No hydronephrosis or hydroureter. Bladder is normal. Stomach/Bowel: Stomach is within normal limits. Appendix appears normal. No evidence of bowel wall thickening, distention, or inflammatory changes. Vascular/Lymphatic: Minimal aortic calcification. No aneurysm. No significant lymphadenopathy. Hazy fat density in the retroperitoneum along the left periaortic space likely representing an area of fat necrosis. Reproductive: Uterus is enlarged with somewhat nodular contour likely representing uterine fibroids. No abnormal adnexal masses. Small amount of free fluid in the pelvis is likely physiologic. Other: No free air in the abdomen. Abdominal wall musculature appears intact. Musculoskeletal: Normal alignment of the lumbar spine. Schmorl's node at superior endplate of L1. No acute bony abnormality suggested. IMPRESSION: 1. No acute process demonstrated in the abdomen or pelvis. No evidence of bowel obstruction or inflammation. No renal or ureteral stone or obstruction. 2. Small amount of free fluid in the pelvis is likely physiologic. 3. Small amount of fat necrosis suggested in the left periaortic retroperitoneum. Electronically Signed   By: Burman Nieves M.D.   On: 03/08/2023 22:56   DG Chest Portable 1 View Result Date: 03/08/2023 CLINICAL DATA:  Chest pain. EXAM: PORTABLE CHEST 1 VIEW COMPARISON:  Chest radiograph dated March 26, 2016. FINDINGS: The heart size and mediastinal contours are within normal limits. No focal consolidation, sizeable pleural effusion, or pneumothorax. No acute osseous abnormality. IMPRESSION: No acute cardiopulmonary findings. Electronically Signed   By: Hart Robinsons M.D.   On: 03/08/2023 21:00    Data  Reviewed: Relevant notes from primary care and specialist visits, past discharge summaries as available in EHR, including Care Everywhere. Prior diagnostic testing as pertinent to current admission diagnoses Updated medications and problem lists for reconciliation ED course, including vitals, labs, imaging, treatment and response to treatment Triage notes, nursing and pharmacy notes and ED provider's notes Notable results as noted in HPI  Assessment and Plan: Abnormal urinalysis Urinalysis    Component Value Date/Time   COLORURINE YELLOW (A) 03/08/2023 2229   APPEARANCEUR HAZY (A) 03/08/2023 2229   LABSPEC 1.012 03/08/2023 2229   PHURINE 6.0 03/08/2023 2229   GLUCOSEU NEGATIVE 03/08/2023 2229   HGBUR MODERATE (A) 03/08/2023 2229   BILIRUBINUR NEGATIVE 03/08/2023 2229   KETONESUR NEGATIVE 03/08/2023 2229   PROTEINUR NEGATIVE 03/08/2023 2229   NITRITE NEGATIVE 03/08/2023 2229   LEUKOCYTESUR MODERATE (A) 03/08/2023 2229      Elevated glucose Will add A1c suspect underlying diabetes mellitus type 2  Abdominal pain Lipase and imaging wnl. ? If bowel ischemic . Gi consult and IV PPI as deemed  appropriate.     DVT prophylaxis:  Heparin   Consults:  None   Advance Care Planning:    Code Status: Not on file   Family Communication:  none  Disposition Plan:  Home   Severity of Illness: The appropriate patient status for this patient is INPATIENT. Inpatient status is judged to be reasonable and necessary in order to provide the required intensity of service to ensure the patient's safety. The patient's presenting symptoms, physical exam findings, and initial radiographic and laboratory data in the context of their chronic comorbidities is felt to place them at high risk for further clinical deterioration. Furthermore, it is not anticipated that the patient will be medically stable for discharge from the hospital within 2 midnights of admission.   * I certify that  at the  point of admission it is my clinical judgment that the patient will require inpatient hospital care spanning beyond 2 midnights from the point of admission due to high intensity of service, high risk for further deterioration and high frequency of surveillance required.*  Author: Gertha Calkin, MD 03/09/2023 2:34 AM  For on call review [email or web address].  Orders Placed This Encounter  Procedures   Resp panel by RT-PCR (RSV, Flu A&B, Covid) Anterior Nasal Swab    Standing Status:   Standing    Number of Occurrences:   1   Urine Culture    Standing Status:   Standing    Number of Occurrences:   1    Indication:   Sepsis   DG Chest Portable 1 View    Standing Status:   Standing    Number of Occurrences:   1    Reason for Exam (SYMPTOM  OR DIAGNOSIS REQUIRED):   chest pain    Is patient pregnant?:   No   CT ABDOMEN PELVIS W CONTRAST    Standing Status:   Standing    Number of Occurrences:   1    Does the patient have a contrast media/X-ray dye allergy?:   No    If indicated for the ordered procedure, I authorize the administration of contrast media per Radiology protocol:   Yes    If indicated for the ordered procedure, I authorize the administration of oral contrast media per Radiology protocol:   Yes   CBC with Differential    Standing Status:   Standing    Number of Occurrences:   1   Comprehensive metabolic panel    Standing Status:   Standing    Number of Occurrences:   1   Lipase, blood    Standing Status:   Standing    Number of Occurrences:   1   hCG, quantitative, pregnancy    Standing Status:   Standing    Number of Occurrences:   1   Urinalysis, Routine w reflex microscopic -Urine, Clean Catch    Standing Status:   Standing    Number of Occurrences:   1    Specimen Source:   Urine, Clean Catch [76]   Lactic acid, plasma    Standing Status:   Standing    Number of Occurrences:   2   Consult to hospitalist    Standing Status:   Standing    Number of Occurrences:   1     Place call to::   [phone]    Reason for Consult:   Admit   ED EKG    Standing Status:   Standing    Number of Occurrences:   1    Reason for Exam:   Chest Pain   EKG 12-Lead    Standing Status:   Standing    Number of Occurrences:   1

## 2023-03-09 NOTE — Assessment & Plan Note (Signed)
Urinalysis    Component Value Date/Time   COLORURINE YELLOW (A) 03/08/2023 2229   APPEARANCEUR HAZY (A) 03/08/2023 2229   LABSPEC 1.012 03/08/2023 2229   PHURINE 6.0 03/08/2023 2229   GLUCOSEU NEGATIVE 03/08/2023 2229   HGBUR MODERATE (A) 03/08/2023 2229   BILIRUBINUR NEGATIVE 03/08/2023 2229   KETONESUR NEGATIVE 03/08/2023 2229   PROTEINUR NEGATIVE 03/08/2023 2229   NITRITE NEGATIVE 03/08/2023 2229   LEUKOCYTESUR MODERATE (A) 03/08/2023 2229

## 2023-03-09 NOTE — Progress Notes (Addendum)
  Brief Progress Note (See full H&P from earlier today)   Subjective: Pt reports abd pain persistent Stabbing, generalized, worse w/ movement, location midline/epigastric, radiating to groin. No nausea, vomiting, diarrhea, constipation. Reports vaginal spotting, reports typically has heavy periods last one about a week ago, no vaginal pain.    Objective: Relevant new results:  CTA reviewed Physical Exam:  BP 120/71   Pulse (!) 110   Temp 98.2 F (36.8 C) (Oral)   Resp 20   Ht 5\' 3"  (1.6 m)   Wt 97 kg   SpO2 98%   BMI 37.88 kg/m  Constitutional:  General Appearance: alert, well-developed, well-nourished, NAD Respiratory: Normal respiratory effort Breath sounds normal, no wheeze/rhonchi/rales Cardiovascular: S1/S2 normal, no murmur/rub/gallop auscultated No lower extremity edema Gastrointestinal: Diffuse tenderness no masses BS WNL Musculoskeletal:  No clubbing/cyanosis of digits Neurological: No cranial nerve deficit on limited exam Psychiatric: Normal judgment/insight Normal mood and affect   Assessment/Plan changes or updates compared to H&P: 12/22: ED w/u (+)UTI, no significant concern on imaging, (+)SIRS question sepsis w/ tachycardia and tachypnea w/ elevated WBC. UDS collected 22:49 (+)amphetamines - pt reports a friend has given her phentermine which she has been taking past several days, PDMP w/o Rx for such. (+)opiates also but pt was given morphine in ED at 21:23 12/23: admitted to hospitalist service overnight. Surgical consult given abd pain oop clinical findings. CTA abd to eval ischemia. GI eval pending. Porphyria seems less likely than ruptured ovarian cyst, UTI would not seem to explain all findings but could explain WNC count. Pt reports HR almost always in low 100's.  Marland Kitchen

## 2023-03-09 NOTE — ED Notes (Signed)
Pt sleeping. Significant other at bedside.

## 2023-03-09 NOTE — ED Notes (Signed)
Pt sleeping at this time.

## 2023-03-09 NOTE — Consult Note (Signed)
[facility] SURGICAL CONSULTATION NOTE (initial) - cpt: 76160   HISTORY OF PRESENT ILLNESS (HPI):  42 y.o. female presented to [facility] ED last night for evaluation of abdominal pain. Patient reports the acute onset of initially epigastric abdominal pain yesterday morning. Pain onset very suddenly. This is reportedly severe. Pain radiates to her flanks and into her lower abdomen. She reports associated nausea. No fever, chills, emesis, bowel changes, CP, SOB. She denied any history of similar. No history of post-prandial pain. She denied any significant NSAID use. She is a smoker. Previous surgeries positive for C-section. Work up in the ED revealed a leukocytosis to 15.1K (now 16.3K), Hgb to 11.5, sCr - 0.49, hyponatremia to 133, lipase normal at 30, UA with leukocytes with Cx pending, UDS positive for amphetamines and opiates, venous lactate was normal at 1.0. She did undergo CT Abdomen/Pelvis which was without acute finding to explain her pain. She was admitted to the medicine service. She is on ceftriaxone for possible UTI  Surgery is consulted by Dr. Joneen Roach, MD in this context for evaluation and management of abdominal pain out of proportion without clear etiology.  PAST MEDICAL HISTORY (PMH):  Past Medical History:  Diagnosis Date   Family history of ovarian cancer 09/2017   cancer genetic testing letter sent   Migraines    Thyroid disease      PAST SURGICAL HISTORY (PSH):  Past Surgical History:  Procedure Laterality Date   CESAREAN SECTION     MOUTH SURGERY     SHOULDER ARTHROSCOPY     SHOULDER SURGERY       MEDICATIONS:  Prior to Admission medications   Medication Sig Start Date End Date Taking? Authorizing Provider  ondansetron (ZOFRAN) 4 MG tablet Take 1 tablet (4 mg total) by mouth every 8 (eight) hours as needed for nausea or vomiting. Patient not taking: Reported on 03/09/2023 06/22/20   Junie Spencer, FNP  SUMAtriptan (IMITREX) 50 MG tablet Take 1 tablet  (50 mg total) by mouth every 2 (two) hours as needed for migraine. May repeat in 2 hours if headache persists or recurs. Patient not taking: Reported on 03/09/2023 06/22/20   Junie Spencer, FNP     ALLERGIES:  Allergies  Allergen Reactions   Tramadol Nausea And Vomiting     SOCIAL HISTORY:  Social History   Socioeconomic History   Marital status: Single    Spouse name: Not on file   Number of children: Not on file   Years of education: Not on file   Highest education level: Not on file  Occupational History   Not on file  Tobacco Use   Smoking status: Every Day    Current packs/day: 1.00    Types: Cigarettes   Smokeless tobacco: Never  Vaping Use   Vaping status: Never Used  Substance and Sexual Activity   Alcohol use: Yes    Comment: occasionally   Drug use: No   Sexual activity: Yes    Birth control/protection: None  Other Topics Concern   Not on file  Social History Narrative   ** Merged History Encounter **       Social Drivers of Corporate investment banker Strain: Not on file  Food Insecurity: Not on file  Transportation Needs: Not on file  Physical Activity: Not on file  Stress: Not on file  Social Connections: Not on file  Intimate Partner Violence: Not on file     FAMILY HISTORY:  Family History  Problem Relation  Age of Onset   Hypertension Mother    Ovarian cancer Mother 55   Hypothyroidism Maternal Grandmother    COPD Paternal Grandmother    Breast cancer Cousin 9   Ovarian cancer Cousin 20   COPD Cousin    Hypothyroidism Maternal Uncle       REVIEW OF SYSTEMS:  Review of Systems  Constitutional:  Negative for chills and fever.  Cardiovascular:  Negative for chest pain and palpitations.  Gastrointestinal:  Positive for abdominal pain and nausea. Negative for constipation, diarrhea and vomiting.  Genitourinary:  Negative for dysuria and urgency.  All other systems reviewed and are negative.   VITAL SIGNS:  Temp:  [98.2 F (36.8  C)-99.4 F (37.4 C)] 99.4 F (37.4 C) (12/23 0253) Pulse Rate:  [106-123] 109 (12/23 0900) Resp:  [20-36] 25 (12/23 0900) BP: (107-145)/(49-83) 130/76 (12/23 0900) SpO2:  [89 %-98 %] 97 % (12/23 0900) Weight:  [97 kg] 97 kg (12/22 2035)     Height: 5\' 3"  (160 cm) Weight: 97 kg BMI (Calculated): 37.89   INTAKE/OUTPUT:  12/22 0701 - 12/23 0700 In: 2165.6 [IV Piggyback:2165.6] Out: -   PHYSICAL EXAM:  Physical Exam Vitals and nursing note reviewed. Exam conducted with a chaperone present.  Constitutional:      Appearance: She is well-developed. She is obese.     Comments: Patient resting in bed; appears uncomfortable Family at bedside   HENT:     Head: Normocephalic and atraumatic.  Eyes:     General: No scleral icterus.    Extraocular Movements: Extraocular movements intact.  Cardiovascular:     Rate and Rhythm: Tachycardia present.     Heart sounds: Normal heart sounds.  Pulmonary:     Effort: Pulmonary effort is normal. No respiratory distress.  Abdominal:     General: Abdomen is flat. A surgical scar is present. There is no distension.     Palpations: Abdomen is soft.     Tenderness: There is abdominal tenderness in the epigastric area, periumbilical area and suprapubic area. There is no guarding or rebound.     Comments: Abdomen is soft, she is tender through epigastrium, periumbilically, and in the suprapubic region with even light palpation, she is not overtly distended.   Genitourinary:    Comments: Deferred Skin:    General: Skin is warm and dry.     Coloration: Skin is not jaundiced.     Findings: No erythema.  Neurological:     General: No focal deficit present.     Mental Status: She is alert and oriented to person, place, and time.  Psychiatric:        Mood and Affect: Mood normal.        Behavior: Behavior normal.      Labs:     Latest Ref Rng & Units 03/09/2023    2:47 AM 03/08/2023    9:08 PM 01/11/2018    5:26 PM  CBC  WBC 4.0 - 10.5 K/uL 16.3   15.1  8.6   Hemoglobin 12.0 - 15.0 g/dL 41.3  24.4  01.0   Hematocrit 36.0 - 46.0 % 36.3  41.0  41.8   Platelets 150 - 400 K/uL 212  226  244       Latest Ref Rng & Units 03/09/2023    2:47 AM 03/08/2023    9:08 PM 01/11/2018    5:26 PM  CMP  Glucose 70 - 99 mg/dL  272  536   BUN 6 - 20 mg/dL  6  9   Creatinine 0.44 - 1.00 mg/dL 1.19  1.47  8.29   Sodium 135 - 145 mmol/L  133  138   Potassium 3.5 - 5.1 mmol/L  3.6  3.8   Chloride 98 - 111 mmol/L  101  104   CO2 22 - 32 mmol/L  22  27   Calcium 8.9 - 10.3 mg/dL  8.5  9.3   Total Protein 6.5 - 8.1 g/dL  6.6    Total Bilirubin <1.2 mg/dL  0.6    Alkaline Phos 38 - 126 U/L  108    AST 15 - 41 U/L  18    ALT 0 - 44 U/L  17       Imaging studies:   CT Abdomen/Pelvis (03/08/2023) personally reviewed without gross findings to explain her pain, appendix appears normal, no gallbladder changes, small bowel is normal caliber, no free air, no pneumatosis, and radiologist report reviewed below:  IMPRESSION: 1. No acute process demonstrated in the abdomen or pelvis. No evidence of bowel obstruction or inflammation. No renal or ureteral stone or obstruction. 2. Small amount of free fluid in the pelvis is likely physiologic. 3. Small amount of fat necrosis suggested in the left periaortic retroperitoneum.   Assessment/Plan:  42 y.o. female with central abdominal pain out of proportion without clear etiology.   - Remains unclear as to her source of her abdominal pain. Urinary etiology is certainly possible but presentation is and location of pain is unusual. Her lactic acid level is normal x2 but she remains with leukocytosis and tachycardia. UDS is positive for amphetamines which could be playing a role. Fortunately, there is no indication for surgical intervnetion currently. One potential would be to consider CTA vs repeat CT Abdomen/Pelvis with PO and IV contrast tomorrow pending clinical condition    - Okay for CLD - Agree with Abx  (Rocephin) for UTI   - Monitor abdominal examination - Pain control prn; antiemetics prn - Monitor leukocytosis - Mobilize as tolerated - Further management per primary service; we will follow    All of the above findings and recommendations were discussed with the patient, and all of patient's questions were answered to her expressed satisfaction.  Thank you for the opportunity to participate in this patient's care.   -- Lynden Oxford, PA-C [facility] 03/09/2023, 9:26 AM M-F: 7am - 4pm

## 2023-03-09 NOTE — ED Notes (Signed)
Patient provided ginger ale per request.

## 2023-03-09 NOTE — Assessment & Plan Note (Signed)
Lipase and imaging wnl. ? If bowel ischemic . Gi consult and IV PPI as deemed  appropriate.

## 2023-03-09 NOTE — ED Notes (Signed)
PT at bedside.

## 2023-03-09 NOTE — Progress Notes (Signed)
OT Cancellation Note  Patient Details Name: Kendra Clements MRN: [id number] DOB: 11-20-1980   Cancelled Treatment:    Reason Eval/Treat Not Completed: OT screened, no needs identified, will sign off. Discussed with PT - pt currently close to baseline with the exception of abdominal pain. Currently MOD I for mobility/ADLs. OT will complete orders, if new needs arise, please re-consult.  Kayelynn Abdou L. Tasmin Exantus, OTR/L  03/09/23, 10:25 AM

## 2023-03-09 NOTE — Assessment & Plan Note (Signed)
Will add A1c suspect underlying diabetes mellitus type 2

## 2023-03-09 NOTE — Hospital Course (Addendum)
HPI: Kendra Clements is a 42 y.o. female presents to the ER 03/08/23 for evaluation of acute epigastric pain radiating into the right groin.  Is never had pain like this before.  Started earlier in the day associated with some nausea and vomiting.  Does hurt worse with movement.  Denies any dysuria.  No vaginal bleeding.  Does not think that she is pregnant.  Last menstrual cycle was this past week.   Hospital course / significant events:  12/22: ED w/u (+)UTI, no significant concern on imaging, (+)SIRS question sepsis w/ tachycardia and tachypnea w/ elevated WBC. UDS collected 22:49 (+)amphetamines, PDMP w/o Rx for such. (+)opiates also but pt was given morphine in ED at 21:23 12/23: admitted to hospitalist service overnight. Surgical and GI consult given abd pain oop clinical findings. CTA abd negative for ischemia. General surgery nothing to add. GI eval pending. Porphyria eval pending but suspect much more likely rupture of ovarian cyst which would not show on imaging now and would explain lower/central abd pain radiation to groin, as opposed to other GI process.   Consultants:  General Surgery Gastroenterology  Procedures/Surgeries: None       ASSESSMENT & PLAN:       *** based on BMI: Body mass index is 37.88 kg/m.  Underweight - under 18  overweight - 25 to 29 obese - 30 or more Class 1 obesity: BMI of 30.0 to 34 Class 2 obesity: BMI of 35.0 to 39 Class 3 obesity: BMI of 40.0 to 49 Super Morbid Obesity: BMI 50-59 Super-super Morbid Obesity: BMI 60+ Significantly low or high BMI is associated with higher medical risk.  Weight management advised as adjunct to other disease management and risk reduction treatments    DVT prophylaxis: *** IV fluids: *** continuous IV fluids  Nutrition: *** Central lines / invasive devices: ***  Code Status: *** ACP documentation reviewed: *** none on file in [facility]  TOC needs: *** Barriers to dispo / significant pending items:  ***

## 2023-03-09 NOTE — Progress Notes (Signed)
SLP Cancellation Note  Patient Details Name: Kendra Clements MRN: [id number] DOB: 1981-01-20   Cancelled treatment:       Reason Eval/Treat Not Completed:  (chart reviewed; NSG consulted)  NSG and PT staff reported no speech-language deficits during engagements/eval w/ pt this morning, MD has cancelled the order also. ST available if any needs while admitted.      Jerilynn Som, MS, CCC-SLP Speech Language Pathologist Rehab Services; [facility] [phone] (ascom) [name] 03/09/2023, 11:32 AM

## 2023-03-10 DIAGNOSIS — R1084 Generalized abdominal pain: Secondary | ICD-10-CM | POA: Diagnosis not present

## 2023-03-10 LAB — COMPREHENSIVE METABOLIC PANEL
ALT: 13 U/L (ref 0–44)
AST: 11 U/L — ABNORMAL LOW (ref 15–41)
Albumin: 2.8 g/dL — ABNORMAL LOW (ref 3.5–5.0)
Alkaline Phosphatase: 86 U/L (ref 38–126)
Anion gap: 8 (ref 5–15)
BUN: 8 mg/dL (ref 6–20)
CO2: 23 mmol/L (ref 22–32)
Calcium: 8.3 mg/dL — ABNORMAL LOW (ref 8.9–10.3)
Chloride: 102 mmol/L (ref 98–111)
Creatinine, Ser: 0.38 mg/dL — ABNORMAL LOW (ref 0.44–1.00)
GFR, Estimated: >60 mL/min (ref >60)
Glucose, Bld: 108 mg/dL — ABNORMAL HIGH (ref 70–99)
Potassium: 3.2 mmol/L — ABNORMAL LOW (ref 3.5–5.1)
Sodium: 133 mmol/L — ABNORMAL LOW (ref 135–145)
Total Bilirubin: 0.8 mg/dL (ref <1.2)
Total Protein: 6.1 g/dL — ABNORMAL LOW (ref 6.5–8.1)

## 2023-03-10 LAB — CBC WITH DIFFERENTIAL/PLATELET
Abs Immature Granulocytes: 0.05 10*3/uL (ref 0.00–0.07)
Basophils Absolute: 0 10*3/uL (ref 0.0–0.1)
Basophils Relative: 0 %
Eosinophils Absolute: 0.1 10*3/uL (ref 0.0–0.5)
Eosinophils Relative: 1 %
HCT: 34.9 % — ABNORMAL LOW (ref 36.0–46.0)
Hemoglobin: 11 g/dL — ABNORMAL LOW (ref 12.0–15.0)
Immature Granulocytes: 1 %
Lymphocytes Relative: 16 %
Lymphs Abs: 1.3 10*3/uL (ref 0.7–4.0)
MCH: 25.1 pg — ABNORMAL LOW (ref 26.0–34.0)
MCHC: 31.5 g/dL (ref 30.0–36.0)
MCV: 79.7 fL — ABNORMAL LOW (ref 80.0–100.0)
Monocytes Absolute: 0.5 10*3/uL (ref 0.1–1.0)
Monocytes Relative: 6 %
Neutro Abs: 6.3 10*3/uL (ref 1.7–7.7)
Neutrophils Relative %: 76 %
Platelets: 229 10*3/uL (ref 150–400)
RBC: 4.38 MIL/uL (ref 3.87–5.11)
RDW: 14.6 % (ref 11.5–15.5)
WBC: 8.3 10*3/uL (ref 4.0–10.5)
nRBC: 0 % (ref 0.0–0.2)

## 2023-03-10 LAB — URINE CULTURE

## 2023-03-10 MED ORDER — BOOST / RESOURCE BREEZE PO LIQD CUSTOM: 1.0000 | Freq: Three times a day (TID) | ORAL | Status: DC

## 2023-03-10 MED ORDER — ADULT MULTIVITAMIN W/MINERALS CH: 1.0000 | ORAL_TABLET | Freq: Every day | ORAL | Status: DC

## 2023-03-10 NOTE — Progress Notes (Signed)
Initial Nutrition Assessment  DOCUMENTATION CODES:   Obesity unspecified  INTERVENTION:   -Boost Breeze po TID, each supplement provides 250 kcal and 9 grams of protein  -MVI with minerals daily -RD will follow for diet advancement and add supplements as appropriate  NUTRITION DIAGNOSIS:   Increased nutrient needs related to acute illness as evidenced by estimated needs.  GOAL:   Patient will meet greater than or equal to 90% of their needs  MONITOR:   PO intake, Supplement acceptance, Diet advancement  REASON FOR ASSESSMENT:   Consult Assessment of nutrition requirement/status  ASSESSMENT:   Pt  with past medical history  of tramadol allergy, history of migraines and hypothyroidism presenting with abd pain going from epigastric to bilateral ribs radiating down into the groin. patient noticed heavy menstruation morning of admission and no reports of any dysuria.  Pt admitted with abdominal pain, UTI, and SIRS.  Reviewed I/O's: +100 ml x 24 hours and +2.6 L since admission  Pt unavailable at time of visit. Attempted to speak with pt via call to hospital room phone, however, unable to reach. RD unable to obtain further nutrition-related history or complete nutrition-focused physical exam at this time.     Per RN notes, pt passed clots this morning.   Per MD notes, plan for GI consults. General surgery evaluated and di not find any intra-abdominal process. CTA has been added to evaluate ischemia.   Per SLP notes, no swallowing deficits noted.   Pt currently on a clear liquid diet. No meal completion data available to assess at this time.   Reviewed wt hx; no significant wt loss noted over the past 3 years.  Medications reviewed and include colace and rocephin.  Lab Results  Component Value Date   HGBA1C 6.0 (H) 03/09/2023   PTA DM medications are none.   Labs reviewed: Na: 133, K: 3.2, Mg: 1.5, CBGS: 110 (inpatient orders for glycemic control are none).    Diet  Order:   Diet Order             Diet clear liquid Room service appropriate? Yes; Fluid consistency: Thin  Diet effective now                   EDUCATION NEEDS:   No education needs have been identified at this time  Skin:  Skin Assessment: Reviewed RN Assessment  Last BM:  Unknown  Height:   Ht Readings from Last 1 Encounters:  03/08/23 5\' 3"  (1.6 m)    Weight:   Wt Readings from Last 1 Encounters:  03/08/23 97 kg    Ideal Body Weight:  52.3 kg  BMI:  Body mass index is 37.88 kg/m.  Estimated Nutritional Needs:   Kcal:  1600-1800  Protein:  80-95 grams  Fluid:  > 1.6 L    Levada Schilling, RD, LDN, CDCES Registered Dietitian III Certified Diabetes Care and Education Specialist If unable to reach this RD, please use "RD Inpatient" group chat on secure chat between hours of 8am-4 pm daily

## 2023-03-10 NOTE — Discharge Summary (Signed)
Physician Discharge Summary   Patient: Kendra Clements MRN: [id number]  DOB: [birth year]   Admit:     Date of Admission: 03/08/2023 Admitted from: home   Discharge: Date of discharge: 03/10/23 Disposition:  LEFT HOSPITAL AGAINT MEDICAL ADVICE  Condition at discharge: stable based on chart review, was not able to examine patient prior to leaving AMA  CODE STATUS: FULL CODE     Discharge Physician: Sunnie Nielsen, DO [facility]     PCP: Evelene Croon, MD  Recommendations for Outpatient Follow-up:  Follow up with PCP      Discharge Diagnoses: Principal Problem:   Abdominal pain Active Problems:   Elevated glucose   Abnormal urinalysis       HPI: Kendra Clements is a 42 y.o. female presents to the ER 03/08/23 for evaluation of acute epigastric pain radiating into the right groin.  Is never had pain like this before.  Started earlier in the day associated with some nausea and vomiting.  Does hurt worse with movement.  Denies any dysuria.  No vaginal bleeding.  Does not think that she is pregnant.  Last menstrual cycle was this past week.   Hospital course / significant events:  12/22: ED w/u (+)UTI, no significant concern on imaging, (+)SIRS question sepsis w/ tachycardia and tachypnea w/ elevated WBC. UDS collected 22:49 (+)amphetamines, PDMP w/o Rx for such. (+)opiates also but pt was given morphine in ED at 21:23 12/23: admitted to hospitalist service overnight. Surgical and GI consult given abd pain oop clinical findings. CTA abd negative for ischemia. General surgery nothing to add. GI eval pending. Porphyria eval pending but suspect much more likely rupture of ovarian cyst which would not show on imaging now and would explain lower/central abd pain radiation to groin, as opposed to other GI process.  12/24: this morning, received chat from RN re: patient is upset and wanting to leave. RN at my instruction informed patient that we were  still waiting on GI consult, possible GYN consult pending GI recs, lab results, and advised wait for this workup/recommendations. I made myself available for questions, but patient declined to wait to see me this morning and signed out AMA.     Consultants:  General Surgery Gastroenterology  Procedures/Surgeries: None       ASSESSMENT & PLAN:   Abd pain uncertain etiology, have ruled out severe life-threatening process such as intrabdominal infection, appendicitis, cholecystitis. She has UA c/w UTI, no abx rx on d/c since pt left AMA. I do not believe UTI explains her symptoms, given relatively sudden onset and findings of some fluid in abdomen on CT I suspect ruptured ovarian cyst though am not able to explain the severe persistent pain oop to exam/imaging. Pt was evaluated by general surgery. Pt left prior to evaluation by gastroenterology. Porphyria not high on the DDx but screening test is in process for PBG which may take some time to result       Discharge Instructions  Allergies as of 03/10/2023       Reactions   Tramadol Nausea And Vomiting     Med rec not completed - pt left AMA       Allergies  Allergen Reactions   Tramadol Nausea And Vomiting     Subjective: per RN note   Discharge Exam: BP (!) 139/90   Pulse 99   Temp 98.5 F (36.9 C) (Oral)   Resp (!) 29   Ht 5\' 3"  (1.6 m)   Wt 97  kg   SpO2 95%   BMI 37.88 kg/m  Unable to physically assess prior to leaving AMA      The results of significant diagnostics from this hospitalization (including imaging, microbiology, ancillary and laboratory) are listed below for reference.     Microbiology: Recent Results (from the past 240 hours)  Resp panel by RT-PCR (RSV, Flu A&B, Covid) Anterior Nasal Swab     Status: None   Collection Time: 03/08/23  9:08 PM   Specimen: Anterior Nasal Swab  Result Value Ref Range Status   SARS Coronavirus 2 by RT PCR NEGATIVE NEGATIVE Final    Comment:  (NOTE) SARS-CoV-2 target nucleic acids are NOT DETECTED.  The SARS-CoV-2 RNA is generally detectable in upper respiratory specimens during the acute phase of infection. The lowest concentration of SARS-CoV-2 viral copies this assay can detect is 138 copies/mL. A negative result does not preclude SARS-Cov-2 infection and should not be used as the sole basis for treatment or other patient management decisions. A negative result may occur with  improper specimen collection/handling, submission of specimen other than nasopharyngeal swab, presence of viral mutation(s) within the areas targeted by this assay, and inadequate number of viral copies(<138 copies/mL). A negative result must be combined with clinical observations, patient history, and epidemiological information. The expected result is Negative.  Fact Sheet for Patients:  BloggerCourse.com  Fact Sheet for Healthcare Providers:  SeriousBroker.it  This test is no t yet approved or cleared by the [place] FDA and  has been authorized for detection and/or diagnosis of SARS-CoV-2 by FDA under an Emergency Use Authorization (EUA). This EUA will remain  in effect (meaning this test can be used) for the duration of the COVID-19 declaration under Section 564(b)(1) of the Act, 21 U.S.C.section 360bbb-3(b)(1), unless the authorization is terminated  or revoked sooner.       Influenza A by PCR NEGATIVE NEGATIVE Final   Influenza B by PCR NEGATIVE NEGATIVE Final    Comment: (NOTE) The Xpert Xpress SARS-CoV-2/FLU/RSV plus assay is intended as an aid in the diagnosis of influenza from Nasopharyngeal swab specimens and should not be used as a sole basis for treatment. Nasal washings and aspirates are unacceptable for Xpert Xpress SARS-CoV-2/FLU/RSV testing.  Fact Sheet for Patients: BloggerCourse.com  Fact Sheet for Healthcare  Providers: SeriousBroker.it  This test is not yet approved or cleared by the [place] FDA and has been authorized for detection and/or diagnosis of SARS-CoV-2 by FDA under an Emergency Use Authorization (EUA). This EUA will remain in effect (meaning this test can be used) for the duration of the COVID-19 declaration under Section 564(b)(1) of the Act, 21 U.S.C. section 360bbb-3(b)(1), unless the authorization is terminated or revoked.     Resp Syncytial Virus by PCR NEGATIVE NEGATIVE Final    Comment: (NOTE) Fact Sheet for Patients: BloggerCourse.com  Fact Sheet for Healthcare Providers: SeriousBroker.it  This test is not yet approved or cleared by the [place] FDA and has been authorized for detection and/or diagnosis of SARS-CoV-2 by FDA under an Emergency Use Authorization (EUA). This EUA will remain in effect (meaning this test can be used) for the duration of the COVID-19 declaration under Section 564(b)(1) of the Act, 21 U.S.C. section 360bbb-3(b)(1), unless the authorization is terminated or revoked.  Performed at [facility], [address] [place], [place] [address]      Labs: BNP (last 3 results) No results for input(s): "BNP" in the last 8760 hours. Basic Metabolic Panel: Recent Labs  Lab 03/08/23 2108 03/09/23 0247 03/10/23 0457  NA 133*  --  133*  K 3.6  --  3.2*  CL 101  --  102  CO2 22  --  23  GLUCOSE 165*  --  108*  BUN 6  --  8  CREATININE 0.49 0.44 0.38*  CALCIUM 8.5*  --  8.3*  MG  --  1.5*  --    Liver Function Tests: Recent Labs  Lab 03/08/23 2108 03/10/23 0457  AST 18 11*  ALT 17 13  ALKPHOS 108 86  BILITOT 0.6 0.8  PROT 6.6 6.1*  ALBUMIN 3.3* 2.8*   Recent Labs  Lab 03/08/23 2108  LIPASE 30   No results for input(s): "AMMONIA" in the last 168 hours. CBC: Recent Labs  Lab 03/08/23 2108 03/09/23 0247 03/10/23 0457  WBC 15.1*  16.3* 8.3  NEUTROABS 13.4*  --  6.3  HGB 13.2 11.5* 11.0*  HCT 41.0 36.3 34.9*  MCV 79.3* 80.3 79.7*  PLT 226 212 229   Cardiac Enzymes: No results for input(s): "CKTOTAL", "CKMB", "CKMBINDEX", "TROPONINI" in the last 168 hours. BNP: Invalid input(s): "POCBNP" CBG: Recent Labs  Lab 03/09/23 0857  GLUCAP 110*   D-Dimer No results for input(s): "DDIMER" in the last 72 hours. Hgb A1c Recent Labs    03/09/23 0247  HGBA1C 6.0*   Lipid Profile No results for input(s): "CHOL", "HDL", "LDLCALC", "TRIG", "CHOLHDL", "LDLDIRECT" in the last 72 hours. Thyroid function studies Recent Labs    03/09/23 0247  TSH <0.010*   Anemia work up No results for input(s): "VITAMINB12", "FOLATE", "FERRITIN", "TIBC", "IRON", "RETICCTPCT" in the last 72 hours. Urinalysis    Component Value Date/Time   COLORURINE YELLOW (A) 03/08/2023 2229   APPEARANCEUR HAZY (A) 03/08/2023 2229   LABSPEC 1.012 03/08/2023 2229   PHURINE 6.0 03/08/2023 2229   GLUCOSEU NEGATIVE 03/08/2023 2229   HGBUR MODERATE (A) 03/08/2023 2229   BILIRUBINUR NEGATIVE 03/08/2023 2229   KETONESUR NEGATIVE 03/08/2023 2229   PROTEINUR NEGATIVE 03/08/2023 2229   NITRITE NEGATIVE 03/08/2023 2229   LEUKOCYTESUR MODERATE (A) 03/08/2023 2229   Sepsis Labs Recent Labs  Lab 03/08/23 2108 03/09/23 0247 03/10/23 0457  WBC 15.1* 16.3* 8.3   Microbiology Recent Results (from the past 240 hours)  Resp panel by RT-PCR (RSV, Flu A&B, Covid) Anterior Nasal Swab     Status: None   Collection Time: 03/08/23  9:08 PM   Specimen: Anterior Nasal Swab  Result Value Ref Range Status   SARS Coronavirus 2 by RT PCR NEGATIVE NEGATIVE Final    Comment: (NOTE) SARS-CoV-2 target nucleic acids are NOT DETECTED.  The SARS-CoV-2 RNA is generally detectable in upper respiratory specimens during the acute phase of infection. The lowest concentration of SARS-CoV-2 viral copies this assay can detect is 138 copies/mL. A negative result does not  preclude SARS-Cov-2 infection and should not be used as the sole basis for treatment or other patient management decisions. A negative result may occur with  improper specimen collection/handling, submission of specimen other than nasopharyngeal swab, presence of viral mutation(s) within the areas targeted by this assay, and inadequate number of viral copies(<138 copies/mL). A negative result must be combined with clinical observations, patient history, and epidemiological information. The expected result is Negative.  Fact Sheet for Patients:  BloggerCourse.com  Fact Sheet for Healthcare Providers:  SeriousBroker.it  This test is no t yet approved or cleared by the [place] FDA and  has been authorized for detection and/or diagnosis of SARS-CoV-2  by FDA under an Emergency Use Authorization (EUA). This EUA will remain  in effect (meaning this test can be used) for the duration of the COVID-19 declaration under Section 564(b)(1) of the Act, 21 U.S.C.section 360bbb-3(b)(1), unless the authorization is terminated  or revoked sooner.       Influenza A by PCR NEGATIVE NEGATIVE Final   Influenza B by PCR NEGATIVE NEGATIVE Final    Comment: (NOTE) The Xpert Xpress SARS-CoV-2/FLU/RSV plus assay is intended as an aid in the diagnosis of influenza from Nasopharyngeal swab specimens and should not be used as a sole basis for treatment. Nasal washings and aspirates are unacceptable for Xpert Xpress SARS-CoV-2/FLU/RSV testing.  Fact Sheet for Patients: BloggerCourse.com  Fact Sheet for Healthcare Providers: SeriousBroker.it  This test is not yet approved or cleared by the [place] FDA and has been authorized for detection and/or diagnosis of SARS-CoV-2 by FDA under an Emergency Use Authorization (EUA). This EUA will remain in effect (meaning this test can be used) for the  duration of the COVID-19 declaration under Section 564(b)(1) of the Act, 21 U.S.C. section 360bbb-3(b)(1), unless the authorization is terminated or revoked.     Resp Syncytial Virus by PCR NEGATIVE NEGATIVE Final    Comment: (NOTE) Fact Sheet for Patients: BloggerCourse.com  Fact Sheet for Healthcare Providers: SeriousBroker.it  This test is not yet approved or cleared by the [place] FDA and has been authorized for detection and/or diagnosis of SARS-CoV-2 by FDA under an Emergency Use Authorization (EUA). This EUA will remain in effect (meaning this test can be used) for the duration of the COVID-19 declaration under Section 564(b)(1) of the Act, 21 U.S.C. section 360bbb-3(b)(1), unless the authorization is terminated or revoked.  Performed at [facility], [address] [place], [place] [address]    Imaging CT ANGIO ABDOMEN W &/OR WO CONTRAST Result Date: 03/09/2023 CLINICAL DATA:  Abdominal pain, leukocytosis, tachycardia, rule out mesenteric ischemia EXAM: CT ANGIOGRAPHY ABDOMEN TECHNIQUE: Multidetector CT imaging of the abdomen was performed using the standard protocol during bolus administration of intravenous contrast. Multiplanar reconstructed images and MIPs were obtained and reviewed to evaluate the vascular anatomy. RADIATION DOSE REDUCTION: This exam was performed according to the departmental dose-optimization program which includes automated exposure control, adjustment of the mA and/or kV according to patient size and/or use of iterative reconstruction technique. CONTRAST:  OMNIPAQUE IOHEXOL 350 MG/ML SOLN COMPARISON:  CT abdomen pelvis, 03/08/2023 FINDINGS: VASCULAR Normal contour and caliber of the abdominal aorta. No evidence of aneurysm, dissection, or other acute aortic pathology. Standard branching pattern of the abdominal aorta with solitary bilateral renal arteries. The mesenteric branch  vessels are widely patent. Scattered aortic atherosclerosis. Review of the MIP images confirms the above findings. NON-VASCULAR Lower Chest: No acute findings. Hepatobiliary: No solid liver abnormality is seen. Hepatomegaly, maximum coronal span 23.4 cm, and hepatic steatosis. No gallstones, gallbladder wall thickening, or biliary dilatation. Pancreas: Unremarkable. No pancreatic ductal dilatation or surrounding inflammatory changes. Spleen: Mild splenomegaly, maximum coronal span 13.5 cm. Adrenals/Urinary Tract: Adrenal glands are unremarkable. Kidneys are normal, without renal calculi, solid lesion, or hydronephrosis. Bladder is unremarkable. Stomach/Bowel: Stomach is within normal limits. No evidence of bowel wall thickening, distention, or inflammatory changes. Lymphatic: No enlarged abdominal lymph nodes. Other: No abdominal wall hernia or abnormality. No ascites. Musculoskeletal: No acute osseous findings. IMPRESSION: 1. Normal contour and caliber of the abdominal aorta. No evidence of aneurysm, dissection, or other acute aortic pathology. The mesenteric branch vessels are widely patent. Scattered  aortic atherosclerosis. 2. Hepatomegaly and hepatic steatosis. 3. Mild splenomegaly. Aortic Atherosclerosis (ICD10-I70.0). Electronically Signed   By: Jearld Lesch M.D.   On: 03/09/2023 10:44   CT ABDOMEN PELVIS W CONTRAST Result Date: 03/08/2023 CLINICAL DATA:  Acute nonlocalized abdominal pain. Upper epigastric pain radiating to the ribs and groin. Symptoms since this morning. EXAM: CT ABDOMEN AND PELVIS WITH CONTRAST TECHNIQUE: Multidetector CT imaging of the abdomen and pelvis was performed using the standard protocol following bolus administration of intravenous contrast. RADIATION DOSE REDUCTION: This exam was performed according to the departmental dose-optimization program which includes automated exposure control, adjustment of the mA and/or kV according to patient size and/or use of iterative  reconstruction technique. CONTRAST:  OMNIPAQUE IOHEXOL 300 MG/ML  SOLN COMPARISON:  None Available. FINDINGS: Lower chest: Lung bases are clear. Hepatobiliary: No focal liver abnormality is seen. No gallstones, gallbladder wall thickening, or biliary dilatation. Pancreas: Unremarkable. No pancreatic ductal dilatation or surrounding inflammatory changes. Spleen: Normal in size without focal abnormality. Adrenals/Urinary Tract: No adrenal gland nodules. Homogeneous and symmetrical nephrograms. Subcentimeter cysts in the right kidney. No imaging follow-up indicated. No hydronephrosis or hydroureter. Bladder is normal. Stomach/Bowel: Stomach is within normal limits. Appendix appears normal. No evidence of bowel wall thickening, distention, or inflammatory changes. Vascular/Lymphatic: Minimal aortic calcification. No aneurysm. No significant lymphadenopathy. Hazy fat density in the retroperitoneum along the left periaortic space likely representing an area of fat necrosis. Reproductive: Uterus is enlarged with somewhat nodular contour likely representing uterine fibroids. No abnormal adnexal masses. Small amount of free fluid in the pelvis is likely physiologic. Other: No free air in the abdomen. Abdominal wall musculature appears intact. Musculoskeletal: Normal alignment of the lumbar spine. Schmorl's node at superior endplate of L1. No acute bony abnormality suggested. IMPRESSION: 1. No acute process demonstrated in the abdomen or pelvis. No evidence of bowel obstruction or inflammation. No renal or ureteral stone or obstruction. 2. Small amount of free fluid in the pelvis is likely physiologic. 3. Small amount of fat necrosis suggested in the left periaortic retroperitoneum. Electronically Signed   By: Burman Nieves M.D.   On: 03/08/2023 22:56   DG Chest Portable 1 View Result Date: 03/08/2023 CLINICAL DATA:  Chest pain. EXAM: PORTABLE CHEST 1 VIEW COMPARISON:  Chest radiograph dated March 26, 2016.  FINDINGS: The heart size and mediastinal contours are within normal limits. No focal consolidation, sizeable pleural effusion, or pneumothorax. No acute osseous abnormality. IMPRESSION: No acute cardiopulmonary findings. Electronically Signed   By: Hart Robinsons M.D.   On: 03/08/2023 21:00      Time coordinating discharge: less than 30 minutes  SIGNED:  Sunnie Nielsen DO [facility]

## 2023-03-10 NOTE — ED Notes (Signed)
NP made aware of patient passing large clots, increasing pain, and elevated HR.  Labs drawn at this time.  Will continue to monitor

## 2023-03-10 NOTE — ED Notes (Signed)
This RN assisted pt to bedside commode, when finished large clots roughly the equivalent of two gold balls along with a moderate amount of blood were noted in the pan.

## 2023-03-10 NOTE — ED Notes (Signed)
Pt called out and in distress. States she is not feeling better and is not getting the help she needs. Pt stating she wants to go to [facility] instead. MD Lyn Hollingshead notified. Pt decided to leave AMA.

## 2023-03-12 LAB — HIGH SENSITIVITY CRP: CRP, High Sensitivity: 220.5 mg/L — ABNORMAL HIGH (ref 0.00–3.00)

## 2023-03-17 LAB — PORPHOBILINOGEN, RANDOM URINE: Quantitative Porphobilinogen: 0.8 mg/L (ref 0.0–2.0)

## 2023-03-20 LAB — PORPHYRINS, FRACTIONATED, RANDOM URINE
Coproporphyrin (CP) I: 61 ug/L — ABNORMAL HIGH (ref 0–15)
Coproporphyrin (CP) III: 69 ug/L — ABNORMAL HIGH (ref 0–49)
Heptacarboxyl (7-CP): <1 ug/L (ref 0–2)
Hexacarboxyl (6-CP): <1 ug/L (ref 0–1)
Pentacarboxyl (5-CP): <1 ug/L (ref 0–2)
Uroporphyrins (UP): 21 ug/L — ABNORMAL HIGH (ref 0–20)

## 2023-08-04 ENCOUNTER — Emergency Department

## 2023-08-04 ENCOUNTER — Other Ambulatory Visit: Payer: Self-pay

## 2023-08-04 ENCOUNTER — Emergency Department
Admission: EM | Admit: 2023-08-04 | Discharge: 2023-08-04 | Disposition: A | Discharge status: Home / Self Care | Attending: Emergency Medicine | Admitting: Emergency Medicine

## 2023-08-04 DIAGNOSIS — M549 Dorsalgia, unspecified: Secondary | ICD-10-CM | POA: Diagnosis present

## 2023-08-04 DIAGNOSIS — M5440 Lumbago with sciatica, unspecified side: Secondary | ICD-10-CM

## 2023-08-04 DIAGNOSIS — M62838 Other muscle spasm: Secondary | ICD-10-CM | POA: Insufficient documentation

## 2023-08-04 DIAGNOSIS — M5442 Lumbago with sciatica, left side: Secondary | ICD-10-CM | POA: Diagnosis not present

## 2023-08-04 LAB — POC URINE PREG, ED: Preg Test, Ur: NEGATIVE

## 2023-08-04 MED ORDER — IBUPROFEN 600 MG PO TABS: 600.0000 mg | ORAL_TABLET | Freq: Three times a day (TID) | ORAL | 0 refills | Status: AC | PRN

## 2023-08-04 MED ORDER — IBUPROFEN 800 MG PO TABS
800.0000 mg | ORAL_TABLET | Freq: Once | ORAL | Status: AC
  Administered 2023-08-04: 800 mg via ORAL
  Filled 2023-08-04: qty 1

## 2023-08-04 MED ORDER — CYCLOBENZAPRINE HCL 10 MG PO TABS: 10.0000 mg | ORAL_TABLET | Freq: Every evening | ORAL | 0 refills | Status: AC | PRN
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
## 2023-08-04 NOTE — Discharge Instructions (Addendum)
 You have been diagnosed with trapezius muscle spasm, please take Flexeril  1 tablet by mouth at bedtime, during the day you can take 1 tablet of Profen every 8 hours.  Please come back to ED or go to your PCP if you have new symptoms or symptoms worsen
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
## 2023-10-21 ENCOUNTER — Emergency Department

## 2023-10-21 ENCOUNTER — Emergency Department
Admission: EM | Admit: 2023-10-21 | Discharge: 2023-10-22 | Disposition: A | Discharge status: Home / Self Care | Attending: Emergency Medicine | Admitting: Emergency Medicine

## 2023-10-21 ENCOUNTER — Encounter: Payer: Self-pay | Admitting: Emergency Medicine

## 2023-10-21 ENCOUNTER — Other Ambulatory Visit: Payer: Self-pay

## 2023-10-21 DIAGNOSIS — I517 Cardiomegaly: Secondary | ICD-10-CM | POA: Diagnosis not present

## 2023-10-21 DIAGNOSIS — J189 Pneumonia, unspecified organism: Secondary | ICD-10-CM

## 2023-10-21 DIAGNOSIS — J181 Lobar pneumonia, unspecified organism: Secondary | ICD-10-CM | POA: Insufficient documentation

## 2023-10-21 DIAGNOSIS — R0602 Shortness of breath: Secondary | ICD-10-CM | POA: Diagnosis present

## 2023-10-21 DIAGNOSIS — R079 Chest pain, unspecified: Secondary | ICD-10-CM

## 2023-10-21 DIAGNOSIS — R791 Abnormal coagulation profile: Secondary | ICD-10-CM | POA: Insufficient documentation

## 2023-10-21 LAB — CBC WITH DIFFERENTIAL/PLATELET
Abs Immature Granulocytes: 0.04 K/uL (ref 0.00–0.07)
Basophils Absolute: 0 K/uL (ref 0.0–0.1)
Basophils Relative: 0 %
Eosinophils Absolute: 0.1 K/uL (ref 0.0–0.5)
Eosinophils Relative: 1 %
HCT: 39.5 % (ref 36.0–46.0)
Hemoglobin: 12.1 g/dL (ref 12.0–15.0)
Immature Granulocytes: 0 %
Lymphocytes Relative: 17 %
Lymphs Abs: 1.6 K/uL (ref 0.7–4.0)
MCH: 24 pg — ABNORMAL LOW (ref 26.0–34.0)
MCHC: 30.6 g/dL (ref 30.0–36.0)
MCV: 78.2 fL — ABNORMAL LOW (ref 80.0–100.0)
Monocytes Absolute: 0.5 K/uL (ref 0.1–1.0)
Monocytes Relative: 5 %
Neutro Abs: 7.3 K/uL (ref 1.7–7.7)
Neutrophils Relative %: 77 %
Platelets: 291 K/uL (ref 150–400)
RBC: 5.05 MIL/uL (ref 3.87–5.11)
RDW: 16.1 % — ABNORMAL HIGH (ref 11.5–15.5)
WBC: 9.6 K/uL (ref 4.0–10.5)
nRBC: 0 % (ref 0.0–0.2)

## 2023-10-21 LAB — COMPREHENSIVE METABOLIC PANEL WITH GFR
ALT: 23 U/L (ref 0–44)
AST: 21 U/L (ref 15–41)
Albumin: 3.2 g/dL — ABNORMAL LOW (ref 3.5–5.0)
Alkaline Phosphatase: 102 U/L (ref 38–126)
Anion gap: 7 (ref 5–15)
BUN: 12 mg/dL (ref 6–20)
CO2: 24 mmol/L (ref 22–32)
Calcium: 8.7 mg/dL — ABNORMAL LOW (ref 8.9–10.3)
Chloride: 106 mmol/L (ref 98–111)
Creatinine, Ser: 0.5 mg/dL (ref 0.44–1.00)
GFR, Estimated: >60 mL/min (ref >60)
Glucose, Bld: 193 mg/dL — ABNORMAL HIGH (ref 70–99)
Potassium: 3.5 mmol/L (ref 3.5–5.1)
Sodium: 137 mmol/L (ref 135–145)
Total Bilirubin: 0.7 mg/dL (ref 0.0–1.2)
Total Protein: 6.4 g/dL — ABNORMAL LOW (ref 6.5–8.1)

## 2023-10-21 LAB — TROPONIN I (HIGH SENSITIVITY)
Troponin I (High Sensitivity): 14 ng/L (ref <18)
Troponin I (High Sensitivity): 18 ng/L — ABNORMAL HIGH (ref <18)

## 2023-10-21 LAB — POC URINE PREG, ED: Preg Test, Ur: NEGATIVE

## 2023-10-21 LAB — LIPASE, BLOOD: Lipase: 33 U/L (ref 11–51)

## 2023-10-21 LAB — D-DIMER, QUANTITATIVE: D-Dimer, Quant: 2.22 ug{FEU}/mL — ABNORMAL HIGH (ref 0.00–0.50)

## 2023-10-21 MED ORDER — ALUM & MAG HYDROXIDE-SIMETH 200-200-20 MG/5ML PO SUSP
30.0000 mL | Freq: Once | ORAL | Status: AC
  Administered 2023-10-21: 30 mL via ORAL
  Filled 2023-10-21: qty 30

## 2023-10-21 MED ORDER — KETOROLAC TROMETHAMINE 15 MG/ML IJ SOLN
15.0000 mg | Freq: Once | INTRAMUSCULAR | Status: AC
  Administered 2023-10-21: 15 mg via INTRAVENOUS
  Filled 2023-10-21: qty 1

## 2023-10-21 MED ORDER — SODIUM CHLORIDE 0.9 % IV BOLUS
1000.0000 mL | Freq: Once | INTRAVENOUS | Status: AC
  Administered 2023-10-21: 1000 mL via INTRAVENOUS

## 2023-10-21 MED ORDER — IOHEXOL 350 MG/ML SOLN
100.0000 mL | Freq: Once | INTRAVENOUS | Status: AC | PRN
  Administered 2023-10-21: 100 mL via INTRAVENOUS

## 2023-10-21 MED ORDER — LIDOCAINE VISCOUS HCL 2 % MT SOLN
15.0000 mL | Freq: Once | OROMUCOSAL | Status: AC
  Administered 2023-10-21: 15 mL via OROMUCOSAL
  Filled 2023-10-21: qty 15

## 2023-10-21 NOTE — ED Notes (Signed)
 ED Provider at bedside.

## 2023-10-21 NOTE — ED Provider Notes (Signed)
 [facility] Provider Note    Event Date/Time   First MD Initiated Contact with Patient 10/21/23 2309     (approximate)   History   Chest Pain   HPI  Kendra Clements is a 43 year old female presenting to the emergency department for evaluation of chest pain and shortness of breath.  Reports symptoms started on the 20th, have been intermittent.  Today, had worsened symptoms leading her to present to the ER.  Does report recent increase stressors including death of her brother.    Physical Exam   Triage Vital Signs: ED Triage Vitals  Encounter Vitals Group     BP 10/21/23 1846 (!) 187/119     Girls Systolic BP Percentile --      Girls Diastolic BP Percentile --      Boys Systolic BP Percentile --      Boys Diastolic BP Percentile --      Pulse Rate 10/21/23 1846 (!) 117     Resp 10/21/23 1846 18     Temp 10/21/23 1846 98.2 F (36.8 C)     Temp Source 10/21/23 1846 Oral     SpO2 10/21/23 1846 98 %     Weight 10/21/23 1845 220 lb (99.8 kg)     Height 10/21/23 1845 5' 3 (1.6 m)     Head Circumference --      Peak Flow --      Pain Score 10/21/23 1845 8     Pain Loc --      Pain Education --      Exclude from Growth Chart --     Most recent vital signs: Vitals:   10/21/23 2238 10/22/23 0000  BP: (!) 187/118 (!) 183/92  Pulse: (!) 116 (!) 114  Resp: 20 (!) 22  Temp: 98 F (36.7 C)   SpO2: 98% 94%     General: Awake, interactive  CV:  Tachycardic with regular rhythm, normal peripheral perfusion Resp:  Respirations not significantly labored, lungs clear to auscultation Abd:  Nondistended, soft Neuro:  Symmetric facial movement, fluid speech   ED Results / Procedures / Treatments   Labs (all labs ordered are listed, but only abnormal results are displayed) Labs Reviewed  CBC WITH DIFFERENTIAL/PLATELET - Abnormal; Notable for the following components:      Result Value   MCV 78.2 (*)    MCH 24.0 (*)    RDW 16.1 (*)    All  other components within normal limits  COMPREHENSIVE METABOLIC PANEL WITH GFR - Abnormal; Notable for the following components:   Glucose, Bld 193 (*)    Calcium 8.7 (*)    Total Protein 6.4 (*)    Albumin 3.2 (*)    All other components within normal limits  D-DIMER, QUANTITATIVE - Abnormal; Notable for the following components:   D-Dimer, Quant 2.22 (*)    All other components within normal limits  TROPONIN I (HIGH SENSITIVITY) - Abnormal; Notable for the following components:   Troponin I (High Sensitivity) 18 (*)    All other components within normal limits  LIPASE, BLOOD  POC URINE PREG, ED  TROPONIN I (HIGH SENSITIVITY)     EKG EKG independently reviewed and interpreted by myself demonstrates:  Initial EKG demonstrates sinus tachycardia at a rate of 113, PR 133, QRS 84, QTc 505, no acute ST changes Repeat EKG demonstrate sinus tachycardia at a rate of 119, PR 129, QRS 84, QTc 510, PVCs noted, nonspecific ST changes, no STEMI  RADIOLOGY  Imaging independently reviewed and interpreted by myself demonstrates:  CXR with questionable consultation in the right base CT of the chest without PE, does redemonstrate areas concerning for possible consolidation, radiology also notes areas concerning for possible edema as well as borderline adenopathy  Formal Radiology Read:  CT Angio Chest PE W and/or Wo Contrast Result Date: 10/21/2023 CLINICAL DATA:  Chest pain short of breath EXAM: CT ANGIOGRAPHY CHEST WITH CONTRAST TECHNIQUE: Multidetector CT imaging of the chest was performed using the standard protocol during bolus administration of intravenous contrast. Multiplanar CT image reconstructions and MIPs were obtained to evaluate the vascular anatomy. RADIATION DOSE REDUCTION: This exam was performed according to the departmental dose-optimization program which includes automated exposure control, adjustment of the mA and/or kV according to patient size and/or use of iterative reconstruction  technique. CONTRAST:  OMNIPAQUE  IOHEXOL  350 MG/ML SOLN COMPARISON:  Chest x-Myangel Summons 10/21/2023 FINDINGS: Cardiovascular: Satisfactory opacification of the pulmonary arteries to the segmental level. No evidence of pulmonary embolism. Nonaneurysmal aorta. Mild atherosclerosis. Mild coronary vascular calcification. Borderline cardiomegaly. No sizable pericardial effusion Mediastinum/Nodes: Patent trachea. No suspicious thyroid  mass. Mild mediastinal adenopathy. Right upper paratracheal nodes measuring up to 13 mm. Lower paratracheal nodes measuring up to 15 mm. Subcarinal lymph node measures 20 mm. Small bilateral hilar nodes measuring up to 10 mm on the right and 10 mm on the left. Esophagus within normal limits. Lungs/Pleura: Moderate bilateral pleural effusions. Mild smooth septal thickening. Partial lower lobe consolidations. Bilateral bronchial wall thickening. Upper Abdomen: No acute finding. Borderline to mild splenomegaly, spleen measuring 14.1 cm. Small volume ascites within the abdomen. Musculoskeletal: No acute osseous abnormality. Review of the MIP images confirms the above findings. IMPRESSION: 1. Negative for acute pulmonary embolus. 2. Borderline cardiomegaly with moderate pleural effusions and diffuse mild smooth septal thickening suggestive of edema 3. Bilateral bronchial wall thickening consistent with airways inflammatory process. Partial lower lobe consolidations could reflect atelectasis or pneumonia 4. Mild mediastinal and hilar adenopathy, indeterminate for reactive versus neoplastic process. Consider short interval CT follow-up in 3 months to reassess. 5. Borderline to mild splenomegaly. Small volume ascites within the abdomen. 6. Aortic atherosclerosis. Aortic Atherosclerosis (ICD10-I70.0). Electronically Signed   By: Luke Bun M.D.   On: 10/21/2023 21:17   DG Chest Portable 1 View Result Date: 10/21/2023 CLINICAL DATA:  Chest pain and shortness of breath for over 2 weeks. EXAM:  PORTABLE CHEST 1 VIEW COMPARISON:  Portable chest 03/08/2023. FINDINGS: There are small bilateral pleural effusions on the right-greater-than-left. In the right base there is overlying patchy atelectasis or consolidation. The remaining lungs are clear. The cardiomediastinal silhouette and vascular pattern are normal. There is calcification in the transverse aorta. Thoracic cage is intact. Overlying monitor wires. IMPRESSION: 1. Small bilateral pleural effusions, right-greater-than-left. 2. Patchy atelectasis or consolidation in the right base. 3. Aortic atherosclerosis. Electronically Signed   By: Francis Quam M.D.   On: 10/21/2023 20:05    PROCEDURES:  Critical Care performed: No  Procedures  HEART SCORE:    History:               Highly Suspicious - 2                               Moderately Suspicious - 1                               Slightly  Suspicious - 0  ECG                      Significant ST-deviation - 2                               Nonspecific repolarization - 1                               Normal - 0  Age                      > 21 Years Old         - 2                               >23 and <33 Years Old - 1                               <89 years old - 0  Risk Factors       3 risk factors or history or known disease - 2                              1 or 2 risk factors - 1                              No risk factors known - 0  Troponin            3 times the normal limit - 2                              >1 and <3 times the normal limit - 1                              1 times the normal limit - 0 Total Score:    3    MEDICATIONS ORDERED IN ED: Medications  iohexol  (OMNIPAQUE ) 350 MG/ML injection 100 mL (100 mLs Intravenous Contrast Given 10/21/23 2042)  ketorolac  (TORADOL ) 15 MG/ML injection 15 mg (15 mg Intravenous Given 10/21/23 2306)  alum & mag hydroxide-simeth (MAALOX/MYLANTA) 200-200-20 MG/5ML suspension 30 mL (30 mLs Oral Given 10/21/23 2307)  lidocaine  (XYLOCAINE )  2 % viscous mouth solution 15 mL (15 mLs Mouth/Throat Given 10/21/23 2307)  sodium chloride  0.9 % bolus 1,000 mL (1,000 mLs Intravenous New Bag/Given 10/21/23 2305)     IMPRESSION / MDM / ASSESSMENT AND PLAN / ED COURSE  I reviewed the triage vital signs and the nursing notes.  Differential diagnosis includes, but is not limited to, arrhythmia, anemia, lecture abnormality, ACS, PE  Patient's presentation is most consistent with acute presentation with potential threat to life or bodily function.  43 year old female presenting to the emergency department for evaluation of chest pain.  Tachycardic and hypertensive on presentation.  CBC and CMP without critical derangements.  Negative initial troponin, no uptrend on repeat.  Normal lipase.  UPT negative.  D-dimer did return elevated at 2.22.  CTA of the chest was obtained which was without evidence of PE.  It did demonstrate borderline cardiomegaly with findings suggestive of possible edema.  Inflammation as well as possible areas of pneumonia.  Indeterminate adenopathy also noted with recommended short interval CT.  Patient reassessed and updated on results of workup.  She does report a recent cough.  Do think it is reasonable to empirically treat her for pneumonia.  I did discuss the importance of outpatient follow-up.  With cardiomegaly and questionable edema, will have her follow-up with cardiology.  No hypoxia or labored respirations here, do not think she needs admission for this.  Will also have her follow-up with pulmonary nodule clinic given adenopathy noted on her CT.  Patient reassessed following medications.  Does report some improvement in her symptoms.  Remains with mild tachycardia.  I did consider admission, but patient has a low risk heart score and is comfortable with discharge and outpatient follow-up.  She does note that she occasionally feels wheezy at home, no diagnosed history of asthma or COPD but does request albuterol  inhaler.   Not overtly wheezy on exam here, but does have a smoking history, may have symptoms exacerbation in the setting of lung infection.  Will DC with antibiotics, albuterol  inhaler, information for follow-up with cardiology and pulmonary nodule clinic.   FINAL CLINICAL IMPRESSION(S) / ED DIAGNOSES   Final diagnoses:  Nonspecific chest pain  Pneumonia due to infectious organism, unspecified laterality, unspecified part of lung  Cardiomegaly     Rx / DC Orders   ED Discharge Orders          Ordered    Ambulatory referral to Cardiology       Comments: If you have not heard from the Cardiology office within the next 72 hours please call [phone].   10/22/23 0024    AMB  Referral to Pulmonary Nodule Clinic        10/22/23 0024    albuterol  (VENTOLIN  HFA) 108 (90 Base) MCG/ACT inhaler  Every 6 hours PRN        10/22/23 0026    amoxicillin -clavulanate (AUGMENTIN ) 875-125 MG tablet  2 times daily        10/22/23 0026    azithromycin  (ZITHROMAX ) 500 MG tablet  Daily        10/22/23 0026             Note:  This document was prepared using Dragon voice recognition software and may include unintentional dictation errors.   Levander Slate, MD 10/22/23 [phone]

## 2023-10-21 NOTE — ED Notes (Signed)
 Upon rounding, pt reports sudden onset of upper abd pain w/ nausea and one episode of diarrhea. Ray, MD notified.
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
## 2023-10-22 ENCOUNTER — Encounter: Payer: Self-pay | Admitting: Pulmonary Disease

## 2023-10-22 ENCOUNTER — Inpatient Hospital Stay
Admission: EM | Admit: 2023-10-22 | Discharge: 2023-10-27 | DRG: 871 | Disposition: A | Discharge status: Home / Self Care | Attending: Internal Medicine | Admitting: Internal Medicine

## 2023-10-22 ENCOUNTER — Other Ambulatory Visit: Payer: Self-pay

## 2023-10-22 DIAGNOSIS — A419 Sepsis, unspecified organism: Principal | ICD-10-CM | POA: Diagnosis present

## 2023-10-22 DIAGNOSIS — E66812 Obesity, class 2: Secondary | ICD-10-CM | POA: Insufficient documentation

## 2023-10-22 DIAGNOSIS — J189 Pneumonia, unspecified organism: Secondary | ICD-10-CM | POA: Insufficient documentation

## 2023-10-22 DIAGNOSIS — R109 Unspecified abdominal pain: Secondary | ICD-10-CM

## 2023-10-22 DIAGNOSIS — Z604 Social exclusion and rejection: Secondary | ICD-10-CM | POA: Diagnosis present

## 2023-10-22 DIAGNOSIS — K50019 Crohn's disease of small intestine with unspecified complications: Secondary | ICD-10-CM

## 2023-10-22 DIAGNOSIS — K5000 Crohn's disease of small intestine without complications: Secondary | ICD-10-CM | POA: Diagnosis present

## 2023-10-22 DIAGNOSIS — I5021 Acute systolic (congestive) heart failure: Secondary | ICD-10-CM | POA: Diagnosis present

## 2023-10-22 DIAGNOSIS — G43909 Migraine, unspecified, not intractable, without status migrainosus: Secondary | ICD-10-CM | POA: Diagnosis present

## 2023-10-22 DIAGNOSIS — K529 Noninfective gastroenteritis and colitis, unspecified: Secondary | ICD-10-CM | POA: Insufficient documentation

## 2023-10-22 DIAGNOSIS — Z885 Allergy status to narcotic agent status: Secondary | ICD-10-CM

## 2023-10-22 DIAGNOSIS — Z634 Disappearance and death of family member: Secondary | ICD-10-CM

## 2023-10-22 DIAGNOSIS — Z8349 Family history of other endocrine, nutritional and metabolic diseases: Secondary | ICD-10-CM

## 2023-10-22 DIAGNOSIS — Z8249 Family history of ischemic heart disease and other diseases of the circulatory system: Secondary | ICD-10-CM

## 2023-10-22 DIAGNOSIS — Z1152 Encounter for screening for COVID-19: Secondary | ICD-10-CM

## 2023-10-22 DIAGNOSIS — Z825 Family history of asthma and other chronic lower respiratory diseases: Secondary | ICD-10-CM

## 2023-10-22 DIAGNOSIS — I110 Hypertensive heart disease with heart failure: Secondary | ICD-10-CM | POA: Diagnosis present

## 2023-10-22 DIAGNOSIS — Z79899 Other long term (current) drug therapy: Secondary | ICD-10-CM

## 2023-10-22 DIAGNOSIS — F419 Anxiety disorder, unspecified: Secondary | ICD-10-CM | POA: Diagnosis present

## 2023-10-22 DIAGNOSIS — Z6838 Body mass index (BMI) 38.0-38.9, adult: Secondary | ICD-10-CM

## 2023-10-22 DIAGNOSIS — F17210 Nicotine dependence, cigarettes, uncomplicated: Secondary | ICD-10-CM | POA: Diagnosis present

## 2023-10-22 DIAGNOSIS — Z8041 Family history of malignant neoplasm of ovary: Secondary | ICD-10-CM

## 2023-10-22 DIAGNOSIS — Z803 Family history of malignant neoplasm of breast: Secondary | ICD-10-CM

## 2023-10-22 LAB — URINALYSIS, ROUTINE W REFLEX MICROSCOPIC
Bacteria, UA: NONE SEEN
Bilirubin Urine: NEGATIVE
Glucose, UA: NEGATIVE mg/dL
Ketones, ur: NEGATIVE mg/dL
Leukocytes,Ua: NEGATIVE
Nitrite: NEGATIVE
Protein, ur: 100 mg/dL — AB
RBC / HPF: >50 RBC/hpf (ref 0–5)
Specific Gravity, Urine: 1.026 (ref 1.005–1.030)
Squamous Epithelial / HPF: 0 /HPF (ref 0–5)
pH: 5 (ref 5.0–8.0)

## 2023-10-22 LAB — CBC
HCT: 41.5 % (ref 36.0–46.0)
Hemoglobin: 13 g/dL (ref 12.0–15.0)
MCH: 24.4 pg — ABNORMAL LOW (ref 26.0–34.0)
MCHC: 31.3 g/dL (ref 30.0–36.0)
MCV: 77.9 fL — ABNORMAL LOW (ref 80.0–100.0)
Platelets: 316 K/uL (ref 150–400)
RBC: 5.33 MIL/uL — ABNORMAL HIGH (ref 3.87–5.11)
RDW: 16 % — ABNORMAL HIGH (ref 11.5–15.5)
WBC: 12.2 K/uL — ABNORMAL HIGH (ref 4.0–10.5)
nRBC: 0 % (ref 0.0–0.2)

## 2023-10-22 LAB — COMPREHENSIVE METABOLIC PANEL WITH GFR
ALT: 19 U/L (ref 0–44)
AST: 17 U/L (ref 15–41)
Albumin: 3.4 g/dL — ABNORMAL LOW (ref 3.5–5.0)
Alkaline Phosphatase: 103 U/L (ref 38–126)
Anion gap: 11 (ref 5–15)
BUN: 10 mg/dL (ref 6–20)
CO2: 23 mmol/L (ref 22–32)
Calcium: 8.7 mg/dL — ABNORMAL LOW (ref 8.9–10.3)
Chloride: 103 mmol/L (ref 98–111)
Creatinine, Ser: 0.47 mg/dL (ref 0.44–1.00)
GFR, Estimated: >60 mL/min (ref >60)
Glucose, Bld: 136 mg/dL — ABNORMAL HIGH (ref 70–99)
Potassium: 3.9 mmol/L (ref 3.5–5.1)
Sodium: 137 mmol/L (ref 135–145)
Total Bilirubin: 1.1 mg/dL (ref 0.0–1.2)
Total Protein: 6.8 g/dL (ref 6.5–8.1)

## 2023-10-22 LAB — POC URINE PREG, ED: Preg Test, Ur: NEGATIVE

## 2023-10-22 LAB — LIPASE, BLOOD: Lipase: 28 U/L (ref 11–51)

## 2023-10-22 MED ORDER — AMOXICILLIN-POT CLAVULANATE 875-125 MG PO TABS: 1.0000 | ORAL_TABLET | Freq: Two times a day (BID) | ORAL | 0 refills | Status: DC

## 2023-10-22 MED ORDER — AZITHROMYCIN 500 MG PO TABS: 500.0000 mg | ORAL_TABLET | Freq: Every day | ORAL | 0 refills | Status: DC

## 2023-10-22 MED ORDER — LACTATED RINGERS IV BOLUS
1000.0000 mL | Freq: Once | INTRAVENOUS | Status: AC
  Administered 2023-10-23: 1000 mL via INTRAVENOUS

## 2023-10-22 MED ORDER — ALBUTEROL SULFATE HFA 108 (90 BASE) MCG/ACT IN AERS: 2.0000 | INHALATION_SPRAY | Freq: Four times a day (QID) | RESPIRATORY_TRACT | 0 refills | Status: AC | PRN

## 2023-10-22 MED ORDER — MORPHINE SULFATE (PF) 4 MG/ML IV SOLN
4.0000 mg | Freq: Once | INTRAVENOUS | Status: AC
  Administered 2023-10-23: 4 mg via INTRAVENOUS
  Filled 2023-10-22: qty 1

## 2023-10-22 MED ORDER — ONDANSETRON HCL 4 MG/2ML IJ SOLN
4.0000 mg | INTRAMUSCULAR | Status: AC
  Administered 2023-10-23: 4 mg via INTRAVENOUS
  Filled 2023-10-22: qty 2

## 2023-10-22 MED ORDER — KETOROLAC TROMETHAMINE 30 MG/ML IJ SOLN
15.0000 mg | Freq: Once | INTRAMUSCULAR | Status: AC
  Administered 2023-10-23: 15 mg via INTRAVENOUS
  Filled 2023-10-22: qty 1

## 2023-10-22 NOTE — ED Provider Notes (Incomplete)
 [facility] Provider Note    Event Date/Time   First MD Initiated Contact with Patient 10/22/23 2312     (approximate)   History   Abdominal Pain   HPI Kendra Clements is a 43 y.o. female who is for evaluation of severe upper abdominal pain that radiates throughout her abdomen.  It is accompanied with nausea and vomiting.  She said that it started a couple of weeks ago but has been intermittent.  She was here about 2 days ago for further evaluation and had a CT a of the chest to rule out pulmonary embolism; at the time the pain was thought to be more in the chest although the patient reports that the pain is in her upper abdomen and radiating both into her lower abdomen and into her chest.  Nothing in particular makes the pain better and moving around and taking deep breaths makes it worse.  She is not certain if eating makes it worse because she has not had much of an appetite.  No fever, no dysuria.     Physical Exam   Triage Vital Signs: ED Triage Vitals  Encounter Vitals Group     BP 10/22/23 2028 (!) 178/118     Girls Systolic BP Percentile --      Girls Diastolic BP Percentile --      Boys Systolic BP Percentile --      Boys Diastolic BP Percentile --      Pulse Rate 10/22/23 2028 (!) 113     Resp 10/22/23 2028 20     Temp 10/22/23 2028 97.9 F (36.6 C)     Temp Source 10/22/23 2028 Oral     SpO2 10/22/23 2028 97 %     Weight 10/22/23 2025 99.8 kg (220 lb)     Height 10/22/23 2025 1.6 m (5' 3)     Head Circumference --      Peak Flow --      Pain Score 10/22/23 2025 10     Pain Loc --      Pain Education --      Exclude from Growth Chart --     Most recent vital signs: Vitals:   10/22/23 2330 10/23/23 0406  BP: (!) 176/107 (!) 142/97  Pulse: (!) 114 95  Resp: (!) 21 18  Temp: 98 F (36.7 C) 98.4 F (36.9 C)  SpO2: 98% 96%    General: Awake, appears uncomfortable but not in severe distress. CV:  Good peripheral perfusion.   Tachycardia, regular rhythm. Resp:  Patient is splinting a little bit likely due to discomfort with borderline tachypnea.  Lungs are clear to auscultation. Abd:  Obese.  Severe tenderness to palpation of the epigastrium and right upper quadrant with guarding and positive Murphy sign.  However, the patient guards to palpation throughout her abdomen, although the guarding is worse in the upper abdomen.   ED Results / Procedures / Treatments   Labs (all labs ordered are listed, but only abnormal results are displayed) Labs Reviewed  COMPREHENSIVE METABOLIC PANEL WITH GFR - Abnormal; Notable for the following components:      Result Value   Glucose, Bld 136 (*)    Calcium 8.7 (*)    Albumin 3.4 (*)    All other components within normal limits  CBC - Abnormal; Notable for the following components:   WBC 12.2 (*)    RBC 5.33 (*)    MCV 77.9 (*)    MCH 24.4 (*)  RDW 16.0 (*)    All other components within normal limits  URINALYSIS, ROUTINE W REFLEX MICROSCOPIC - Abnormal; Notable for the following components:   Color, Urine AMBER (*)    APPearance HAZY (*)    Hgb urine dipstick LARGE (*)    Protein, ur 100 (*)    All other components within normal limits  RESP PANEL BY RT-PCR (RSV, FLU A&B, COVID)  [id number]  CULTURE, BLOOD (ROUTINE X 2)  CULTURE, BLOOD (ROUTINE X 2)  LIPASE, BLOOD  LACTIC ACID, PLASMA  LACTIC ACID, PLASMA  POC URINE PREG, ED     EKG  ED ECG REPORT I, Darleene Dome, the attending physician, personally viewed and interpreted this ECG.  Date: 10/22/2023 EKG Time: 20: 26 Rate: 115 Rhythm: Sinus tachycardia with frequent PVCs QRS Axis: normal Intervals: normal ST/T Wave abnormalities: Non-specific ST segment / T-wave changes, but no clear evidence of acute ischemia. Narrative Interpretation: no definitive evidence of acute ischemia; does not meet STEMI criteria.    RADIOLOGY See ED course for details   PROCEDURES:  Critical Care performed: Yes, see  critical care procedure note(s)  .Critical Care  Performed by: Dome Darleene, MD Authorized by: Dome Darleene, MD   Critical care provider statement:    Critical care time (minutes):  30   Critical care time was exclusive of:  Separately billable procedures and treating other patients   Critical care was necessary to treat or prevent imminent or life-threatening deterioration of the following conditions:  Sepsis   Critical care was time spent personally by me on the following activities:  Development of treatment plan with patient or surrogate, evaluation of patient's response to treatment, examination of patient, obtaining history from patient or surrogate, ordering and performing treatments and interventions, ordering and review of laboratory studies, ordering and review of radiographic studies, pulse oximetry, re-evaluation of patient's condition and review of old charts     IMPRESSION / MDM / ASSESSMENT AND PLAN / ED COURSE  I reviewed the triage vital signs and the nursing notes.                              Differential diagnosis includes, but is not limited to, biliary disease including cholecystitis, pancreatitis, gastritis, ulcer plus or minus perforation, less likely lower abdominal issues such as ruptured ovarian cyst.  Patient's presentation is most consistent with acute presentation with potential threat to life or bodily function.  Labs/studies ordered: Right upper quadrant ultrasound, EKG, CBC, CMP, urine pregnancy, urinalysis, lipase  Interventions/Medications given:  Medications  lactated ringers  bolus 1,000 mL (1,000 mLs Intravenous New Bag/Given 10/23/23 0407)  ceFEPIme  (MAXIPIME ) 2 g in sodium chloride  0.9 % 100 mL IVPB (2 g Intravenous New Bag/Given 10/23/23 0407)  vancomycin  (VANCOREADY) IVPB 2000 mg/400 mL (has no administration in time range)  morphine  (PF) 4 MG/ML injection 4 mg (4 mg Intravenous Given 10/23/23 0046)  ondansetron  (ZOFRAN ) injection 4 mg (4 mg  Intravenous Given 10/23/23 0046)  ketorolac  (TORADOL ) 30 MG/ML injection 15 mg (15 mg Intravenous Given 10/23/23 0045)  lactated ringers  bolus 1,000 mL (0 mLs Intravenous Stopped 10/23/23 0225)  iohexol  (OMNIPAQUE ) 350 MG/ML injection 100 mL (100 mLs Intravenous Contrast Given 10/23/23 0228)  morphine  (PF) 4 MG/ML injection 4 mg (4 mg Intravenous Given 10/23/23 0408)    (Note:  hospital course my include additional interventions and/or labs/studies not listed above.)   Hypertensive and tachycardic, likely due to pain.  Sepsis is  possible but no clear source of infection.  I reviewed the workup from 2 days ago and she had reassuring evaluation with no evidence of PE on CTA chest.  She was treated empirically for possible pneumonia based on the findings in lower lungs on chest CT.  Given the abdominal tenderness and location, I am proceeding with right upper quadrant ultrasound.  Patient knows that I will proceed with abdominal CT if the ultrasound is normal.  Treating with medications as listed above for now including morphine , Zofran , and Toradol .  Patient agrees with the plan.  LR 1 L bolus for tachycardia  The patient is on the cardiac monitor to evaluate for evidence of arrhythmia and/or significant heart rate changes.   Clinical Course as of 10/23/23 0417  Fri Oct 23, 2023  0150 US  ABDOMEN LIMITED RUQ (LIVER/GB) I independently viewed and interpreted the patient's ultrasound I see no evidence of cholecystitis.  Given the patient's symptoms, I am proceeding with CT scan of the abdomen.  Additionally, given the question of a pleural effusion, I am going to also scan through her chest as well to compare from the scan 2 days ago to see if there has been interval change including improvement after empiric antibiotics. [CF]  [phone] CT CHEST ABDOMEN PELVIS W CONTRAST I viewed and interpreted the patient's CT scan.  Patient seems to still have pneumonia in bilateral lungs.  There is also an area of  inflammation in her intestines that the radiologist identified is very consistent with ileitis. [CF]  [id number] I reassessed the patient.  She is still having substantial pain and said that the tenderness and pain was much worse when she was going in the stretcher to CT.  Given the worsening of her symptoms despite empiric treatment, I will admit her for ongoing intractable pain, ileitis, and pneumonia that has not improved although her vital signs are reassuring.  However she does meet sepsis criteria with persistent tachycardia and a leukocytosis and tachypnea so I am making her code sepsis and treating empirically with cefepime  2 g IV and vancomycin  as well as additional fluid boluses to meet 30 mL/kg of ideal body weight fluid goal.  I am ordering lactic acid and blood cultures as well. [CF]  [phone] Consulting hospitalist for admission.  Also ordering an additional morphine  4 mg IV. [CF]  [id number] I consulted by phone with the admitting hospitalist, and they will admit the patient - Dr. Lawence [CF]    Clinical Course User Index [CF] Gordan Huxley, MD     FINAL CLINICAL IMPRESSION(S) / ED DIAGNOSES   Final diagnoses:  Sepsis, due to unspecified organism, unspecified whether acute organ dysfunction present [facility])  Pneumonia of both lower lobes due to infectious organism  Terminal ileitis with complication (HCC)  Intractable abdominal pain     Rx / DC Orders   ED Discharge Orders     None        Note:  This document was prepared using Dragon voice recognition software and may include unintentional dictation errors.   Gordan Huxley, MD 10/23/23 9582    Gordan Huxley, MD 10/23/23 [phone]

## 2023-10-22 NOTE — Discharge Instructions (Addendum)
 You are seen in the emergency room today for evaluation of your chest pain.  Your testing shows that you may have an infection of your lungs known as a pneumonia.  Sent a prescription for antibiotics to your pharmacy.  Have also sent an albuterol  inhaler to see if that helps with your symptoms.  As we discussed, it is important that you follow-up with a cardiologist for further evaluation of your chest pain and possible fluid on your lungs on your CT scan.  It is also important you follow-up with the pulmonology clinic to repeat your CT scan after treating your pneumonia.  Please return to the ER for any new or worsening symptoms.

## 2023-10-22 NOTE — ED Triage Notes (Signed)
 Pt reports upper abd pain that began yesterday, pt reports she was seen here yesterday for same but pain is worse than yesterday. Pt reports pain wraps around her abd and radiates into her back as well. Pt reports some n/v.

## 2023-10-23 ENCOUNTER — Emergency Department

## 2023-10-23 DIAGNOSIS — Z8249 Family history of ischemic heart disease and other diseases of the circulatory system: Secondary | ICD-10-CM | POA: Diagnosis not present

## 2023-10-23 DIAGNOSIS — K529 Noninfective gastroenteritis and colitis, unspecified: Secondary | ICD-10-CM | POA: Diagnosis present

## 2023-10-23 DIAGNOSIS — I5021 Acute systolic (congestive) heart failure: Secondary | ICD-10-CM | POA: Diagnosis present

## 2023-10-23 DIAGNOSIS — Z634 Disappearance and death of family member: Secondary | ICD-10-CM | POA: Diagnosis not present

## 2023-10-23 DIAGNOSIS — K5000 Crohn's disease of small intestine without complications: Secondary | ICD-10-CM | POA: Diagnosis present

## 2023-10-23 DIAGNOSIS — Z803 Family history of malignant neoplasm of breast: Secondary | ICD-10-CM | POA: Diagnosis not present

## 2023-10-23 DIAGNOSIS — Z885 Allergy status to narcotic agent status: Secondary | ICD-10-CM | POA: Diagnosis not present

## 2023-10-23 DIAGNOSIS — R000 Tachycardia, unspecified: Secondary | ICD-10-CM | POA: Diagnosis not present

## 2023-10-23 DIAGNOSIS — E66812 Obesity, class 2: Secondary | ICD-10-CM | POA: Diagnosis present

## 2023-10-23 DIAGNOSIS — Z604 Social exclusion and rejection: Secondary | ICD-10-CM | POA: Diagnosis present

## 2023-10-23 DIAGNOSIS — Z8349 Family history of other endocrine, nutritional and metabolic diseases: Secondary | ICD-10-CM | POA: Diagnosis not present

## 2023-10-23 DIAGNOSIS — I110 Hypertensive heart disease with heart failure: Secondary | ICD-10-CM | POA: Diagnosis present

## 2023-10-23 DIAGNOSIS — I340 Nonrheumatic mitral (valve) insufficiency: Secondary | ICD-10-CM | POA: Diagnosis not present

## 2023-10-23 DIAGNOSIS — Z1152 Encounter for screening for COVID-19: Secondary | ICD-10-CM | POA: Diagnosis not present

## 2023-10-23 DIAGNOSIS — Z79899 Other long term (current) drug therapy: Secondary | ICD-10-CM | POA: Diagnosis not present

## 2023-10-23 DIAGNOSIS — A419 Sepsis, unspecified organism: Secondary | ICD-10-CM

## 2023-10-23 DIAGNOSIS — J189 Pneumonia, unspecified organism: Secondary | ICD-10-CM | POA: Diagnosis present

## 2023-10-23 DIAGNOSIS — F419 Anxiety disorder, unspecified: Secondary | ICD-10-CM | POA: Diagnosis present

## 2023-10-23 DIAGNOSIS — Z6838 Body mass index (BMI) 38.0-38.9, adult: Secondary | ICD-10-CM | POA: Diagnosis not present

## 2023-10-23 DIAGNOSIS — Z8041 Family history of malignant neoplasm of ovary: Secondary | ICD-10-CM | POA: Diagnosis not present

## 2023-10-23 DIAGNOSIS — G43909 Migraine, unspecified, not intractable, without status migrainosus: Secondary | ICD-10-CM | POA: Diagnosis present

## 2023-10-23 DIAGNOSIS — F17210 Nicotine dependence, cigarettes, uncomplicated: Secondary | ICD-10-CM | POA: Diagnosis present

## 2023-10-23 DIAGNOSIS — Z825 Family history of asthma and other chronic lower respiratory diseases: Secondary | ICD-10-CM | POA: Diagnosis not present

## 2023-10-23 DIAGNOSIS — R109 Unspecified abdominal pain: Secondary | ICD-10-CM | POA: Diagnosis present

## 2023-10-23 LAB — CBC
HCT: 35.1 % — ABNORMAL LOW (ref 36.0–46.0)
Hemoglobin: 10.4 g/dL — ABNORMAL LOW (ref 12.0–15.0)
MCH: 23.7 pg — ABNORMAL LOW (ref 26.0–34.0)
MCHC: 29.6 g/dL — ABNORMAL LOW (ref 30.0–36.0)
MCV: 80.1 fL (ref 80.0–100.0)
Platelets: 265 K/uL (ref 150–400)
RBC: 4.38 MIL/uL (ref 3.87–5.11)
RDW: 16.2 % — ABNORMAL HIGH (ref 11.5–15.5)
WBC: 7 K/uL (ref 4.0–10.5)
nRBC: 0 % (ref 0.0–0.2)

## 2023-10-23 LAB — RESP PANEL BY RT-PCR (RSV, FLU A&B, COVID)  RVPGX2
Influenza A by PCR: NEGATIVE
Influenza B by PCR: NEGATIVE
Resp Syncytial Virus by PCR: NEGATIVE
SARS Coronavirus 2 by RT PCR: NEGATIVE

## 2023-10-23 LAB — LACTIC ACID, PLASMA
Lactic Acid, Venous: 1.5 mmol/L (ref 0.5–1.9)
Lactic Acid, Venous: 0.8 mmol/L (ref 0.5–1.9)

## 2023-10-23 LAB — CREATININE, SERUM
Creatinine, Ser: 0.56 mg/dL (ref 0.44–1.00)
GFR, Estimated: >60 mL/min (ref >60)

## 2023-10-23 MED ORDER — SODIUM CHLORIDE 0.9 % IV SOLN
2.0000 g | Freq: Once | INTRAVENOUS | Status: AC
  Administered 2023-10-23: 2 g via INTRAVENOUS
  Filled 2023-10-23: qty 12.5

## 2023-10-23 MED ORDER — DOXYCYCLINE HYCLATE 100 MG PO TABS
100.0000 mg | ORAL_TABLET | Freq: Two times a day (BID) | ORAL | Status: DC
  Administered 2023-10-23 – 2023-10-27 (×12): 100 mg via ORAL
  Filled 2023-10-23 (×10): qty 1

## 2023-10-23 MED ORDER — ONDANSETRON HCL 4 MG/2ML IJ SOLN
4.0000 mg | Freq: Four times a day (QID) | INTRAMUSCULAR | Status: DC | PRN
  Administered 2023-10-25: 4 mg via INTRAVENOUS
  Filled 2023-10-23: qty 2

## 2023-10-23 MED ORDER — MORPHINE SULFATE (PF) 4 MG/ML IV SOLN
4.0000 mg | Freq: Once | INTRAVENOUS | Status: AC
  Administered 2023-10-23: 4 mg via INTRAVENOUS
  Filled 2023-10-23: qty 1

## 2023-10-23 MED ORDER — VANCOMYCIN HCL IN DEXTROSE 1-5 GM/200ML-% IV SOLN: 1000.0000 mg | Freq: Once | INTRAVENOUS | Status: DC

## 2023-10-23 MED ORDER — PANTOPRAZOLE SODIUM 40 MG PO TBEC
40.0000 mg | DELAYED_RELEASE_TABLET | Freq: Two times a day (BID) | ORAL | Status: DC
  Administered 2023-10-23 – 2023-10-27 (×11): 40 mg via ORAL
  Filled 2023-10-23 (×8): qty 1

## 2023-10-23 MED ORDER — NICOTINE 21 MG/24HR TD PT24
21.0000 mg | MEDICATED_PATCH | Freq: Every day | TRANSDERMAL | Status: DC
  Administered 2023-10-23: 21 mg via TRANSDERMAL
  Filled 2023-10-23 (×3): qty 1

## 2023-10-23 MED ORDER — ENOXAPARIN SODIUM 60 MG/0.6ML IJ SOSY
0.5000 mg/kg | PREFILLED_SYRINGE | INTRAMUSCULAR | Status: DC
  Administered 2023-10-23 – 2023-10-26 (×4): 50 mg via SUBCUTANEOUS
  Filled 2023-10-23 (×4): qty 0.6

## 2023-10-23 MED ORDER — VANCOMYCIN HCL 2000 MG/400ML IV SOLN
2000.0000 mg | Freq: Once | INTRAVENOUS | Status: AC
  Administered 2023-10-23: 2000 mg via INTRAVENOUS
  Filled 2023-10-23: qty 400

## 2023-10-23 MED ORDER — LACTATED RINGERS IV BOLUS (SEPSIS)
1000.0000 mL | Freq: Once | INTRAVENOUS | Status: AC
  Administered 2023-10-23: 1000 mL via INTRAVENOUS

## 2023-10-23 MED ORDER — IOHEXOL 350 MG/ML SOLN
100.0000 mL | Freq: Once | INTRAVENOUS | Status: AC | PRN
  Administered 2023-10-23: 100 mL via INTRAVENOUS

## 2023-10-23 MED ORDER — IPRATROPIUM-ALBUTEROL 0.5-2.5 (3) MG/3ML IN SOLN
3.0000 mL | Freq: Four times a day (QID) | RESPIRATORY_TRACT | Status: DC | PRN
  Administered 2023-10-23 – 2023-10-25 (×3): 3 mL via RESPIRATORY_TRACT
  Filled 2023-10-23 (×4): qty 3

## 2023-10-23 MED ORDER — SODIUM CHLORIDE 0.9 % IV SOLN
3.0000 g | Freq: Four times a day (QID) | INTRAVENOUS | Status: DC
  Administered 2023-10-23 – 2023-10-27 (×23): 3 g via INTRAVENOUS
  Filled 2023-10-23 (×18): qty 8

## 2023-10-23 MED ORDER — OXYCODONE-ACETAMINOPHEN 5-325 MG PO TABS
1.0000 | ORAL_TABLET | ORAL | Status: DC | PRN
  Administered 2023-10-23 – 2023-10-26 (×11): 1 via ORAL
  Filled 2023-10-23 (×9): qty 1

## 2023-10-23 NOTE — H&P (Signed)
 History and Physical    Patient: Kendra Clements FMW:[id number] DOB: May 10, 1980 DOA: 10/22/2023 DOS: the patient was seen and examined on 10/23/2023 PCP: Pcp, No  Patient coming from: Home  Chief Complaint:  Chief Complaint  Patient presents with   Abdominal Pain   HPI: Kendra Clements is a 43 y.o. female with medical history significant of migraine headaches, otherwise healthy came to the hospital with abdominal pain and shortness of breath.  Symptoms started about 2 days ago, she had some short of breath, nonproductive cough.  She also had significant abdominal pain localized in the upper stomach, intermittent, cramping-like, 10/10 in severity.  No vomiting that time.  She came to the hospital, had a CT scan of the chest, showed left lower lobe consolidation.  She was given oral antibiotics.  Came back to the hospital earlier today, with worsening abdominal pain.  She also developed nausea and vomiting and some loose stools.  Still has significant short of breath, still cough, but nonproductive.  She did not have any fever or chills.  Upon arriving to hospital, heart rate was elevated to 113, tachypnea of 27, blood pressure elevated, lactic acid level normal.  Troponin 18, white cell count 12.2, lactic acid 1.5 then down to 0.8.  Repeated CT scan showed bilateral lower lobe consolidation, CT abdomen pelvis showed inflammation changes in the distal ileum.  She was given fluids, she was also given cefepime .  Blood culture sent out.  She is admitted to the hospital for treatment.  Review of Systems: As mentioned in the history of present illness. All other systems reviewed and are negative. Past Medical History:  Diagnosis Date   Family history of ovarian cancer 09/2017   cancer genetic testing letter sent   Migraines    Thyroid  disease    Past Surgical History:  Procedure Laterality Date   CESAREAN SECTION     MOUTH SURGERY     SHOULDER ARTHROSCOPY     SHOULDER SURGERY      Social History:  reports that she has been smoking cigarettes. She has never used smokeless tobacco. She reports current alcohol use. She reports that she does not use drugs.  Allergies  Allergen Reactions   Tramadol Nausea And Vomiting    Family History  Problem Relation Age of Onset   Hypertension Mother    Ovarian cancer Mother 6   Hypothyroidism Maternal Grandmother    COPD Paternal Grandmother    Breast cancer Cousin 17   Ovarian cancer Cousin 40   COPD Cousin    Hypothyroidism Maternal Uncle     Prior to Admission medications   Medication Sig Start Date End Date Taking? Authorizing Provider  albuterol  (VENTOLIN  HFA) 108 (90 Base) MCG/ACT inhaler Inhale 2 puffs into the lungs every 6 (six) hours as needed for wheezing or shortness of breath. 10/22/23  Yes Levander Slate, MD  amoxicillin -clavulanate (AUGMENTIN ) 875-125 MG tablet Take 1 tablet by mouth 2 (two) times daily for 7 days. 10/22/23 10/29/23 Yes Ray, Slate, MD  azithromycin  (ZITHROMAX ) 500 MG tablet Take 1 tablet (500 mg total) by mouth daily for 3 days. 10/22/23 10/25/23 Yes Ray, Slate, MD  ondansetron  (ZOFRAN ) 4 MG tablet Take 1 tablet (4 mg total) by mouth every 8 (eight) hours as needed for nausea or vomiting. Patient not taking: Reported on 03/09/2023 06/22/20   Lavell Bari LABOR, FNP  SUMAtriptan  (IMITREX ) 50 MG tablet Take 1 tablet (50 mg total) by mouth every 2 (two) hours as needed for migraine. May  repeat in 2 hours if headache persists or recurs. Patient not taking: Reported on 03/09/2023 06/22/20   Lavell Bari LABOR, FNP    Physical Exam: Vitals:   10/22/23 2330 10/23/23 0406 10/23/23 0615 10/23/23 0732  BP: (!) 176/107 (!) 142/97 (!) 137/94 128/81  Pulse: (!) 114 95 95 95  Resp: (!) 21 18 18 20   Temp: 98 F (36.7 C) 98.4 F (36.9 C) (!) 97.5 F (36.4 C) 97.6 F (36.4 C)  TempSrc: Oral Oral  Oral  SpO2: 98% 96% 94% 94%  Weight:      Height:       Physical Exam Constitutional:      General: She is not in acute  distress.    Appearance: She is obese. She is not ill-appearing or toxic-appearing.  HENT:     Head: Normocephalic and atraumatic.     Mouth/Throat:     Mouth: Mucous membranes are moist.  Eyes:     Extraocular Movements: Extraocular movements intact.     Pupils: Pupils are equal, round, and reactive to light.  Cardiovascular:     Rate and Rhythm: Normal rate and regular rhythm.     Heart sounds: No murmur heard.    No gallop.  Pulmonary:     Effort: Pulmonary effort is normal. No respiratory distress.     Breath sounds: Normal breath sounds. No wheezing or rales.  Abdominal:     General: Abdomen is flat. Bowel sounds are normal. There is no distension.     Palpations: Abdomen is soft.     Tenderness: There is abdominal tenderness in the epigastric area.  Skin:    General: Skin is warm and dry.     Coloration: Skin is not cyanotic.  Neurological:     General: No focal deficit present.     Mental Status: She is alert and oriented to person, place, and time.     Data Reviewed:    Assessment and Plan: Sepsis secondary to pneumonia Bilateral lower lobe pneumonia. Patient met sepsis criteria at time of admission with heart rate and respiratory rate plus mild leukocytosis.  This is secondary to bilateral lower lobe pneumonia. Pneumonia could be due to aspiration from nausea vomiting, alternatively, she may have had a viral infection 2 days ago, then developed secondary pneumonia afterwards. Blood culture sent out, antibiotics will be started with Unasyn  and doxycycline . Currently does not have hypoxemia, she has some shortness of breath, will start as needed DuoNeb.  Acute gastroenteritis. Possible Crohn disease. Patient does not having chronic abdominal pain or diarrhea.  Changes on CT scan may reflect gastroenteritis.  She is on Unasyn , will continue. Patient be referred to GI after discharge from hospital.  Class II obesity. Diet and exercise advised.  Migraine  headache. Continue home medicines.    Advance Care Planning:   Code Status: Full Code patient is a full code.  Consults: None  Family Communication: Boyfriend at the bedside.  Severity of Illness: The appropriate patient status for this patient is INPATIENT. Inpatient status is judged to be reasonable and necessary in order to provide the required intensity of service to ensure the patient's safety. The patient's presenting symptoms, physical exam findings, and initial radiographic and laboratory data in the context of their chronic comorbidities is felt to place them at high risk for further clinical deterioration. Furthermore, it is not anticipated that the patient will be medically stable for discharge from the hospital within 2 midnights of admission.   * I certify that  at the point of admission it is my clinical judgment that the patient will require inpatient hospital care spanning beyond 2 midnights from the point of admission due to high intensity of service, high risk for further deterioration and high frequency of surveillance required.*  Author: Murvin Mana, MD 10/23/2023 10:39 AM  For on call review [email or web address].

## 2023-10-23 NOTE — Progress Notes (Signed)
 CODE SEPSIS - PHARMACY COMMUNICATION  **Broad Spectrum Antibiotics should be administered within 1 hour of Sepsis diagnosis**  Time Code Sepsis Called/Page Received: 9650  Antibiotics Ordered: Cefepime  & Vancomycin   Time of 1st antibiotic administration: 0407  Rankin CANDIE Dills, PharmD, [facility] 10/23/2023 3:56 AM

## 2023-10-23 NOTE — ED Notes (Signed)
 Patient transported to CT

## 2023-10-23 NOTE — Sepsis Progress Note (Signed)
 Elink monitoring for the code sepsis protocol.

## 2023-10-24 DIAGNOSIS — A419 Sepsis, unspecified organism: Secondary | ICD-10-CM | POA: Diagnosis not present

## 2023-10-24 DIAGNOSIS — E66812 Obesity, class 2: Secondary | ICD-10-CM | POA: Diagnosis not present

## 2023-10-24 DIAGNOSIS — K529 Noninfective gastroenteritis and colitis, unspecified: Secondary | ICD-10-CM | POA: Diagnosis not present

## 2023-10-24 DIAGNOSIS — J189 Pneumonia, unspecified organism: Secondary | ICD-10-CM | POA: Diagnosis not present

## 2023-10-24 DIAGNOSIS — R000 Tachycardia, unspecified: Secondary | ICD-10-CM

## 2023-10-24 LAB — CBC
HCT: 35 % — ABNORMAL LOW (ref 36.0–46.0)
Hemoglobin: 10.5 g/dL — ABNORMAL LOW (ref 12.0–15.0)
MCH: 23.9 pg — ABNORMAL LOW (ref 26.0–34.0)
MCHC: 30 g/dL (ref 30.0–36.0)
MCV: 79.7 fL — ABNORMAL LOW (ref 80.0–100.0)
Platelets: 257 K/uL (ref 150–400)
RBC: 4.39 MIL/uL (ref 3.87–5.11)
RDW: 15.7 % — ABNORMAL HIGH (ref 11.5–15.5)
WBC: 6.6 K/uL (ref 4.0–10.5)
nRBC: 0 % (ref 0.0–0.2)

## 2023-10-24 LAB — COMPREHENSIVE METABOLIC PANEL WITH GFR
ALT: 14 U/L (ref 0–44)
AST: 16 U/L (ref 15–41)
Albumin: 3 g/dL — ABNORMAL LOW (ref 3.5–5.0)
Alkaline Phosphatase: 86 U/L (ref 38–126)
Anion gap: 5 (ref 5–15)
BUN: 11 mg/dL (ref 6–20)
CO2: 26 mmol/L (ref 22–32)
Calcium: 8.4 mg/dL — ABNORMAL LOW (ref 8.9–10.3)
Chloride: 107 mmol/L (ref 98–111)
Creatinine, Ser: 0.33 mg/dL — ABNORMAL LOW (ref 0.44–1.00)
GFR, Estimated: >60 mL/min (ref >60)
Glucose, Bld: 108 mg/dL — ABNORMAL HIGH (ref 70–99)
Potassium: 3.6 mmol/L (ref 3.5–5.1)
Sodium: 138 mmol/L (ref 135–145)
Total Bilirubin: 0.5 mg/dL (ref 0.0–1.2)
Total Protein: 6 g/dL — ABNORMAL LOW (ref 6.5–8.1)

## 2023-10-24 LAB — BRAIN NATRIURETIC PEPTIDE: B Natriuretic Peptide: 252.7 pg/mL — ABNORMAL HIGH (ref 0.0–100.0)

## 2023-10-24 LAB — MAGNESIUM: Magnesium: 1.9 mg/dL (ref 1.7–2.4)

## 2023-10-24 MED ORDER — LORAZEPAM 1 MG PO TABS
1.0000 mg | ORAL_TABLET | Freq: Four times a day (QID) | ORAL | Status: DC | PRN
  Administered 2023-10-24 – 2023-10-26 (×4): 1 mg via ORAL
  Filled 2023-10-24 (×3): qty 1

## 2023-10-24 MED ORDER — SUCRALFATE 1 GM/10ML PO SUSP
1.0000 g | Freq: Three times a day (TID) | ORAL | Status: DC
  Administered 2023-10-24 – 2023-10-27 (×11): 1 g via ORAL
  Filled 2023-10-24 (×10): qty 10

## 2023-10-24 MED ORDER — ALUM & MAG HYDROXIDE-SIMETH 200-200-20 MG/5ML PO SUSP
30.0000 mL | Freq: Four times a day (QID) | ORAL | Status: DC | PRN
  Administered 2023-10-24: 30 mL via ORAL
  Filled 2023-10-24: qty 30

## 2023-10-24 MED ORDER — FUROSEMIDE 10 MG/ML IJ SOLN
40.0000 mg | Freq: Once | INTRAMUSCULAR | Status: AC
  Administered 2023-10-24: 40 mg via INTRAVENOUS
  Filled 2023-10-24: qty 4

## 2023-10-24 NOTE — Plan of Care (Signed)

## 2023-10-24 NOTE — Hospital Course (Signed)
 Kendra Clements is a 43 y.o. female with medical history significant of migraine headaches, otherwise healthy came to the hospital with abdominal pain and shortness of breath. Symptoms started about 2 days ago, she had some short of breath, nonproductive cough.  Repeated CT scan showed bilateral lower lobe consolidation, CT abdomen pelvis showed inflammation changes in the distal ileum  Patient was started on antibiotics with Unasyn  and doxycycline  for pneumonia and colitis.  Patient was also given Lasix  for volume overload.  Echocardiogram showed ejection fraction 35 to 40%.  Cardiology evaluation obtained.  Improved, medically stable for discharge.  Patient will follow with cardiology for additional workup as outpatient.

## 2023-10-24 NOTE — Progress Notes (Signed)
 Patient complaints of constant abdominal and chest pain 8-9/10 throughout shift.  Dietary supplements trailed including mild and crackers with desired results not obtained; Ativan  administered with desired results not obtained.  Dr Laurita notified with orders for EKG and further medication with no relief.

## 2023-10-24 NOTE — Progress Notes (Signed)
 Progress Note   Patient: Kendra Clements FMW:[id number] DOB: 1980/09/14 DOA: 10/22/2023     1 DOS: the patient was seen and examined on 10/24/2023   Brief hospital course: Kendra Clements is a 43 y.o. female with medical history significant of migraine headaches, otherwise healthy came to the hospital with abdominal pain and shortness of breath. Symptoms started about 2 days ago, she had some short of breath, nonproductive cough.  Repeated CT scan showed bilateral lower lobe consolidation, CT abdomen pelvis showed inflammation changes in the distal ileum  Patient was started on antibiotics with Unasyn  and doxycycline  for pneumonia and colitis.   Principal Problem:   Sepsis due to undetermined organism [facility]) Active Problems:   Community acquired bilateral lower lobe pneumonia   Gastroenteritis   Class 2 obesity   Assessment and Plan: Sepsis secondary to pneumonia Bilateral lower lobe pneumonia. Patient met sepsis criteria at time of admission with heart rate and respiratory rate plus mild leukocytosis.  This is secondary to bilateral lower lobe pneumonia. Pneumonia could be due to aspiration from nausea vomiting, alternatively, she may have had a viral infection 2 days ago, then developed secondary pneumonia afterwards. Blood culture so far has no growth, antibiotics will be continued with Unasyn  and doxycycline . Also started on DuoNeb as needed.  Persistently short of breath. Minimal elevation in troponin. Possible acute on chronic diastolic congestive heart failure. She is still complaining short of breath after receiving antibiotic treatment.  DuoNeb did not seem to be effective.  CT scan of the chest that admission showed bilateral pleural effusion.  I will check the BNP level, which was 252, which is high with patient's significant obesity. Reviewed patient schedule admission, had some nonspecific ST changes.  I will obtain echocardiogram, give her a dose of IV Lasix .  Will  continue to monitor closely.  Acute gastroenteritis. Possible Crohn disease. Patient does not having chronic abdominal pain or diarrhea.  Changes on CT scan may reflect gastroenteritis.  She is on Unasyn , will continue. Patient be referred to GI after discharge from hospital. Patient still has some loose stools today, will check stool studies including C. difficile and stool stool pathogens.   Class II obesity. Diet and exercise advised.   Migraine headache. Continue home medicines.       Subjective:  Patient still complaining significant short of breath, received breathing treatment, did not work.  No cough.  No chest pain today.  Has some diarrhea  Physical Exam: Vitals:   10/23/23 1958 10/23/23 2020 10/24/23 0500 10/24/23 0724  BP:  (!) 162/103 126/76 118/68  Pulse:  60 99 (!) 110  Resp:  18 18 20   Temp:  97.7 F (36.5 C) 97.6 F (36.4 C) (!) 97.5 F (36.4 C)  TempSrc:  Oral Axillary Oral  SpO2: 95% 91% 97% 91%  Weight:      Height:       General exam: Appears calm and comfortable  Respiratory system: Decreased breath sounds. Respiratory effort normal. Cardiovascular system: S1 & S2 heard, RRR. No JVD, murmurs, rubs, gallops or clicks. No pedal edema. Gastrointestinal system: Abdomen is nondistended, soft and nontender. No organomegaly or masses felt. Normal bowel sounds heard. Central nervous system: Alert and oriented. No focal neurological deficits. Extremities: Symmetric 5 x 5 power. Skin: No rashes, lesions or ulcers Psychiatry: Judgement and insight appear normal. Mood & affect appropriate.    Data Reviewed:  Lab results reviewed.  Family Communication: Boyfriend updated at bedside.  Disposition: Status is: Inpatient Remains  inpatient appropriate because: Severity of disease, IV treatment     Time spent: 55 minutes  Author: Murvin Mana, MD 10/24/2023 12:08 PM  For on call review [email or web address].

## 2023-10-25 ENCOUNTER — Inpatient Hospital Stay (HOSPITAL_COMMUNITY)
Admit: 2023-10-25 | Discharge: 2023-10-25 | Disposition: A | Discharge status: Home / Self Care | Attending: Internal Medicine | Admitting: Internal Medicine

## 2023-10-25 DIAGNOSIS — J189 Pneumonia, unspecified organism: Secondary | ICD-10-CM | POA: Diagnosis not present

## 2023-10-25 DIAGNOSIS — I5021 Acute systolic (congestive) heart failure: Secondary | ICD-10-CM

## 2023-10-25 DIAGNOSIS — A419 Sepsis, unspecified organism: Secondary | ICD-10-CM | POA: Diagnosis not present

## 2023-10-25 DIAGNOSIS — K529 Noninfective gastroenteritis and colitis, unspecified: Secondary | ICD-10-CM | POA: Diagnosis not present

## 2023-10-25 LAB — BASIC METABOLIC PANEL WITH GFR
Anion gap: 9 (ref 5–15)
BUN: 15 mg/dL (ref 6–20)
CO2: 27 mmol/L (ref 22–32)
Calcium: 8.7 mg/dL — ABNORMAL LOW (ref 8.9–10.3)
Chloride: 104 mmol/L (ref 98–111)
Creatinine, Ser: 0.47 mg/dL (ref 0.44–1.00)
GFR, Estimated: >60 mL/min (ref >60)
Glucose, Bld: 126 mg/dL — ABNORMAL HIGH (ref 70–99)
Potassium: 3.8 mmol/L (ref 3.5–5.1)
Sodium: 140 mmol/L (ref 135–145)

## 2023-10-25 LAB — ECHOCARDIOGRAM COMPLETE
AR max vel: 2.41 cm2
AV Area VTI: 2.52 cm2
AV Area mean vel: 2.41 cm2
AV Mean grad: 5.5 mmHg
AV Peak grad: 9.7 mmHg
Ao pk vel: 1.56 m/s
Height: 63 in
S' Lateral: 4.6 cm
Weight: 3520 [oz_av]

## 2023-10-25 LAB — MAGNESIUM: Magnesium: 1.7 mg/dL (ref 1.7–2.4)

## 2023-10-25 MED ORDER — PERFLUTREN LIPID MICROSPHERE
1.0000 mL | INTRAVENOUS | Status: AC | PRN
  Administered 2023-10-25: 4 mL via INTRAVENOUS

## 2023-10-25 MED ORDER — POLYETHYLENE GLYCOL 3350 17 G PO PACK
17.0000 g | PACK | Freq: Every day | ORAL | Status: DC
  Administered 2023-10-25: 17 g via ORAL
  Filled 2023-10-25 (×3): qty 1

## 2023-10-25 NOTE — Progress Notes (Signed)
 Progress Note   Patient: Kendra Clements FMW:[id number] DOB: 1980/05/28 DOA: 10/22/2023     2 DOS: the patient was seen and examined on 10/25/2023   Brief hospital course: Kendra Clements is a 43 y.o. female with medical history significant of migraine headaches, otherwise healthy came to the hospital with abdominal pain and shortness of breath. Symptoms started about 2 days ago, she had some short of breath, nonproductive cough.  Repeated CT scan showed bilateral lower lobe consolidation, CT abdomen pelvis showed inflammation changes in the distal ileum  Patient was started on antibiotics with Unasyn  and doxycycline  for pneumonia and colitis.   Principal Problem:   Sepsis due to undetermined organism [facility]) Active Problems:   Community acquired bilateral lower lobe pneumonia   Gastroenteritis   Class 2 obesity   Assessment and Plan: Sepsis secondary to pneumonia Bilateral lower lobe pneumonia. Patient met sepsis criteria at time of admission with heart rate and respiratory rate plus mild leukocytosis.  This is secondary to bilateral lower lobe pneumonia. Pneumonia could be due to aspiration from nausea vomiting, alternatively, she may have had a viral infection 2 days ago, then developed secondary pneumonia afterwards. Blood culture so far has no growth for 2 days, antibiotics is continued with Unasyn  and doxycycline . Also started on DuoNeb as needed. Patient condition appears to be improving, continue antibiotics for now.   Persistently short of breath. Minimal elevation in troponin. Possible acute on chronic diastolic congestive heart failure. She is still complaining short of breath after receiving antibiotic treatment.  DuoNeb did not seem to be effective.  CT scan of the chest that admission showed bilateral pleural effusion.  I will check the BNP level, which was 252, which is high with patient's significant obesity. Reviewed patient schedule admission, had some  nonspecific ST changes.  Patient received the 40 mg IV Lasix , she was diuresing well, short of breath appeared to be improving today.  No need for additional Lasix .  Pending echocardiogram results.  Continue to follow closely.   Acute gastroenteritis. Possible Crohn disease. Patient does not having chronic abdominal pain or diarrhea.  Changes on CT scan may reflect gastroenteritis.  She is on Unasyn , will continue. Patient be referred to GI after discharge from hospital. Patient no longer has any diarrhea, feels constipated.  Will start MiraLAX . Continue Unasyn  for now.   Class II obesity. Diet and exercise advised.   Migraine headache. Continue home medicines.       Subjective:  Patient still has some upper stomach pain, but seems to be better than yesterday.  No nausea or vomiting. No additional pain in the chest, still has some short of breath, but seem to be better today.  Physical Exam: Vitals:   10/24/23 1612 10/24/23 2208 10/25/23 0419 10/25/23 0851  BP: (!) 152/95 (!) 152/92 (!) 151/97 (!) 168/102  Pulse: (!) 106 (!) 106 (!) 107 77  Resp: 20 17 20 17   Temp: 98 F (36.7 C) 97.6 F (36.4 C) 97.8 F (36.6 C)   TempSrc: Oral  Oral   SpO2: 93% 93% 93% 93%  Weight:      Height:       General exam: Appears calm and comfortable  Respiratory system: Clear to auscultation. Respiratory effort normal. Cardiovascular system: S1 & S2 heard, RRR. No JVD, murmurs, rubs, gallops or clicks. No pedal edema. Gastrointestinal system: Abdomen is nondistended, soft and mild upper abd tender. No organomegaly or masses felt. Normal bowel sounds heard. Central nervous system: Alert and oriented.  No focal neurological deficits. Extremities: Symmetric 5 x 5 power. Skin: No rashes, lesions or ulcers Psychiatry: Judgement and insight appear normal. Mood & affect appropriate.    Data Reviewed:  Lab results reviewed  Family Communication: None  Disposition: Status is:  Inpatient Remains inpatient appropriate because: Severity of disease, IV treatment     Time spent: 35 minutes  Author: Murvin Mana, MD 10/25/2023 11:31 AM  For on call review [email or web address].

## 2023-10-25 NOTE — Plan of Care (Signed)

## 2023-10-25 NOTE — Plan of Care (Signed)
  Problem: Health Behavior/Discharge Planning: Goal: Ability to manage health-related needs will improve Outcome: Progressing   Problem: Activity: Goal: Risk for activity intolerance will decrease Outcome: Progressing   Problem: Pain Managment: Goal: General experience of comfort will improve and/or be controlled Outcome: Progressing   Problem: Skin Integrity: Goal: Risk for impaired skin integrity will decrease Outcome: Progressing

## 2023-10-26 ENCOUNTER — Telehealth (HOSPITAL_COMMUNITY): Payer: Self-pay | Admitting: Pharmacy Technician

## 2023-10-26 ENCOUNTER — Inpatient Hospital Stay (HOSPITAL_COMMUNITY)

## 2023-10-26 ENCOUNTER — Other Ambulatory Visit (HOSPITAL_COMMUNITY): Payer: Self-pay

## 2023-10-26 DIAGNOSIS — I5021 Acute systolic (congestive) heart failure: Secondary | ICD-10-CM | POA: Diagnosis not present

## 2023-10-26 DIAGNOSIS — A419 Sepsis, unspecified organism: Secondary | ICD-10-CM | POA: Diagnosis not present

## 2023-10-26 DIAGNOSIS — J189 Pneumonia, unspecified organism: Secondary | ICD-10-CM | POA: Diagnosis not present

## 2023-10-26 DIAGNOSIS — I340 Nonrheumatic mitral (valve) insufficiency: Secondary | ICD-10-CM | POA: Diagnosis not present

## 2023-10-26 DIAGNOSIS — K529 Noninfective gastroenteritis and colitis, unspecified: Secondary | ICD-10-CM | POA: Diagnosis not present

## 2023-10-26 LAB — LIPID PANEL
Cholesterol: 181 mg/dL (ref 0–200)
HDL: 37 mg/dL — ABNORMAL LOW (ref >40)
LDL Cholesterol: 113 mg/dL — ABNORMAL HIGH (ref 0–99)
Total CHOL/HDL Ratio: 4.9 ratio
Triglycerides: 156 mg/dL — ABNORMAL HIGH (ref <150)
VLDL: 31 mg/dL (ref 0–40)

## 2023-10-26 LAB — HEMOGLOBIN A1C
Hgb A1c MFr Bld: 5.7 % — ABNORMAL HIGH (ref 4.8–5.6)
Mean Plasma Glucose: 117 mg/dL

## 2023-10-26 MED ORDER — LOSARTAN POTASSIUM 25 MG PO TABS: 25.0000 mg | ORAL_TABLET | Freq: Every day | ORAL | Status: DC

## 2023-10-26 MED ORDER — FUROSEMIDE 10 MG/ML IJ SOLN
40.0000 mg | Freq: Two times a day (BID) | INTRAMUSCULAR | Status: DC
  Administered 2023-10-26 (×4): 40 mg via INTRAVENOUS
  Filled 2023-10-26 (×2): qty 4

## 2023-10-26 MED ORDER — GADOBUTROL 1 MMOL/ML IV SOLN
13.0000 mL | Freq: Once | INTRAVENOUS | Status: AC | PRN
  Administered 2023-10-26 (×2): 13 mL via INTRAVENOUS

## 2023-10-26 MED ORDER — METOPROLOL SUCCINATE ER 25 MG PO TB24
25.0000 mg | ORAL_TABLET | Freq: Every day | ORAL | Status: DC
  Administered 2023-10-26 (×2): 25 mg via ORAL
  Filled 2023-10-26: qty 1

## 2023-10-26 MED ORDER — LOSARTAN POTASSIUM 50 MG PO TABS
50.0000 mg | ORAL_TABLET | Freq: Every day | ORAL | Status: DC
  Administered 2023-10-27 (×2): 50 mg via ORAL
  Filled 2023-10-26: qty 1

## 2023-10-26 MED ORDER — METOPROLOL SUCCINATE ER 25 MG PO TB24
25.0000 mg | ORAL_TABLET | Freq: Every day | ORAL | Status: AC
  Administered 2023-10-26 (×2): 25 mg via ORAL
  Filled 2023-10-26: qty 1

## 2023-10-26 MED ORDER — SPIRONOLACTONE 25 MG PO TABS
25.0000 mg | ORAL_TABLET | Freq: Every day | ORAL | Status: DC
  Administered 2023-10-26 – 2023-10-27 (×4): 25 mg via ORAL
  Filled 2023-10-26 (×2): qty 1

## 2023-10-26 MED ORDER — METOPROLOL SUCCINATE ER 50 MG PO TB24
50.0000 mg | ORAL_TABLET | Freq: Every day | ORAL | Status: DC
  Administered 2023-10-27 (×2): 50 mg via ORAL
  Filled 2023-10-26: qty 1

## 2023-10-26 MED ORDER — ATORVASTATIN CALCIUM 20 MG PO TABS
40.0000 mg | ORAL_TABLET | Freq: Every day | ORAL | Status: DC
  Administered 2023-10-27 (×2): 40 mg via ORAL
  Filled 2023-10-26: qty 2

## 2023-10-26 MED ORDER — LOSARTAN POTASSIUM 50 MG PO TABS
50.0000 mg | ORAL_TABLET | Freq: Every day | ORAL | Status: DC
  Administered 2023-10-26 (×2): 50 mg via ORAL
  Filled 2023-10-26: qty 1

## 2023-10-26 NOTE — Plan of Care (Signed)

## 2023-10-26 NOTE — Progress Notes (Signed)
 Heart Failure Navigator Progress Note  Assessed for Heart & Vascular TOC clinic readiness.  Patient does not meet criteria due to current consult with [facility] patient.   Navigator will sign off at this time.  Charmaine Pines, RN, BSN [facility] Heart Failure Navigator Secure Chat Only

## 2023-10-26 NOTE — Consult Note (Addendum)
 [facility] CLINIC CARDIOLOGY CONSULT NOTE       Patient ID: Rhandi Despain MRN: [id number] DOB/AGE: 12/11/1980 43 y.o.  Admit date: 10/22/2023 Referring Physician Dr. Laurita Primary Physician Pcp, No Primary Cardiologist None Reason for Consultation New onset HFrEF  HPI: Jihan Mellette is a 43 y.o. female  with no known cardiac history, with a past medical history of migraine headaches who presented to the ED on 10/22/2023 for abdominal pain and SOB. Per CT chest revealed bilateral lower lobe consolidation and patient treated for PNA. CT abdomen also revealed gastroenteritis. During this  admission Echo revealed newly reduced EF 45-40%, global hypokinesis, mild LVH, mild to moderate MR. Patient denies history of CAD, MI, HF or AF. Cardiology was consulted for further evaluation of new onset HFrEF.  Work up today notable for Na 140, K 3.8, Cr 0.47, Mg 1.7, Hgb 10.5, plts 257. BNP elevated at 250. LA within normal limits. Trops minimally elevated and flat 14 > 18. EKG with sinus tachycardia rate 106 bpm with non specific ST-T wave changes. A1C 5.7%. Blood cultures with no growth in past 3 days. Patient has been on IV and  PO abx for PNA, gastroenteritis. Patient started on IV lasix  40 mg BID this AM.   At the time of my evaluation this AM, patient was resting comfortably in hospital bed. We discussed patients sxs in further detail. Patient states for the past month she's been having worsening chest tightness only with exertion and always associated it with anxiety. 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 Patient states she is very active due to this. Patient states she noticed worsening SOB and orthopnea for the past week and endorses mild lower extremity swelling. Patient also reports she's been very anxious and nervous about her health. Patient shared that her youngest brother died from overdose less than a month ago and became very tearful.  Patient states she's going through a lot and she wants to feel better. Patient currently states she still feels SOB and unable to lay flat. Patient denies chest pain/pressure/tightness, palpitations, or lightheadedness. Patient states Duonebs and Abx haven't helped her SOB very much. Patient denies any cardiac history.    Review of systems complete and found to be negative unless listed above    Past Medical History:  Diagnosis Date   Family history of ovarian cancer 09/2017   cancer genetic testing letter sent   Migraines    Thyroid  disease     Past Surgical History:  Procedure Laterality Date   CESAREAN SECTION     MOUTH SURGERY     SHOULDER ARTHROSCOPY     SHOULDER SURGERY      Medications Prior to Admission  Medication Sig Dispense Refill Last Dose/Taking   albuterol  (VENTOLIN  HFA) 108 (90 Base) MCG/ACT inhaler Inhale 2 puffs into the lungs every 6 (six) hours as needed for wheezing or shortness of breath. 8 g 0 10/22/2023 at  2:00 PM   amoxicillin -clavulanate (AUGMENTIN ) 875-125 MG tablet Take 1 tablet by mouth 2 (two) times daily for 7 days. 14 tablet 0 10/22/2023 at  2:00 PM   [EXPIRED] azithromycin  (ZITHROMAX ) 500 MG tablet Take 1 tablet (500 mg total) by mouth daily for 3 days. 3 tablet 0 10/22/2023 at  2:00 PM   ondansetron  (ZOFRAN ) 4 MG tablet Take 1 tablet (4 mg total) by mouth every 8 (eight) hours as needed for nausea or vomiting. (Patient not taking: Reported on 03/09/2023) 20 tablet  0 Not Taking   SUMAtriptan  (IMITREX ) 50 MG tablet Take 1 tablet (50 mg total) by mouth every 2 (two) hours as needed for migraine. May repeat in 2 hours if headache persists or recurs. (Patient not taking: Reported on 03/09/2023) 10 tablet 0 Not Taking   Social History   Socioeconomic History   Marital status: Single    Spouse name: Not on file   Number of children: Not on file   Years of education: Not on file   Highest education level: Not on file  Occupational History   Not on file   Tobacco Use   Smoking status: Every Day    Current packs/day: 1.00    Types: Cigarettes   Smokeless tobacco: Never  Vaping Use   Vaping status: Never Used  Substance and Sexual Activity   Alcohol use: Yes    Comment: occasionally   Drug use: No   Sexual activity: Yes    Birth control/protection: None  Other Topics Concern   Not on file  Social History Narrative   ** Merged History Encounter **       Social Drivers of Health   Financial Resource Strain: Not on file  Food Insecurity: No Food Insecurity (10/23/2023)   Hunger Vital Sign    Worried About Running Out of Food in the Last Year: Never true    Ran Out of Food in the Last Year: Never true  Transportation Needs: No Transportation Needs (10/23/2023)   PRAPARE - Administrator, Civil Service (Medical): No    Lack of Transportation (Non-Medical): No  Physical Activity: Not on file  Stress: Not on file  Social Connections: Socially Isolated (10/23/2023)   Social Connection and Isolation Panel    Frequency of Communication with Friends and Family: Three times a week    Frequency of Social Gatherings with Friends and Family: Twice a week    Attends Religious Services: Never    Database administrator or Organizations: No    Attends Banker Meetings: Never    Marital Status: Never married  Intimate Partner Violence: Not At Risk (10/23/2023)   Humiliation, Afraid, Rape, and Kick questionnaire    Fear of Current or Ex-Partner: No    Emotionally Abused: No    Physically Abused: No    Sexually Abused: No    Family History  Problem Relation Age of Onset   Hypertension Mother    Ovarian cancer Mother 24   Hypothyroidism Maternal Grandmother    COPD Paternal Grandmother    Breast cancer Cousin 40   Ovarian cancer Cousin 40   COPD Cousin    Hypothyroidism Maternal Uncle      Vitals:   10/25/23 1557 10/25/23 2028 10/26/23 0432 10/26/23 0810  BP: (!) 165/98 (!) 157/114 (!) 142/82 (!) 173/103  Pulse:  (!) 109 (!) 109 (!) 102 (!) 101  Resp: 18   20  Temp: 97.8 F (36.6 C) (!) 97.5 F (36.4 C) 97.7 F (36.5 C) (!) 97.5 F (36.4 C)  TempSrc: Oral Oral Oral Oral  SpO2: 94% 90% 90% 96%  Weight:      Height:        PHYSICAL EXAM General: Well appearing female, well nourished, in no acute distress. HEENT: Normocephalic and atraumatic. Neck: No JVD.   Lungs: Normal respiratory effort on room air. Clear bilaterally to auscultation. No wheezes, crackles, rhonchi.  Heart: HRRR. Normal S1 and S2 without gallops or murmurs.  Abdomen: Non-distended appearing.  Msk: Normal  strength and tone for age. Extremities: Warm and well perfused. No clubbing, cyanosis. Trace pedal edema.  Neuro: Alert and oriented X 3. Psych: Answers questions appropriately.   Labs: Basic Metabolic Panel: Recent Labs    10/24/23 0356 10/25/23 0531  NA 138 140  K 3.6 3.8  CL 107 104  CO2 26 27  GLUCOSE 108* 126*  BUN 11 15  CREATININE 0.33* 0.47  CALCIUM  8.4* 8.7*  MG 1.9 1.7   Liver Function Tests: Recent Labs    10/24/23 0356  AST 16  ALT 14  ALKPHOS 86  BILITOT 0.5  PROT 6.0*  ALBUMIN 3.0*   No results for input(s): LIPASE, AMYLASE in the last 72 hours. CBC: Recent Labs    10/23/23 1102 10/24/23 0356  WBC 7.0 6.6  HGB 10.4* 10.5*  HCT 35.1* 35.0*  MCV 80.1 79.7*  PLT 265 257   Cardiac Enzymes: No results for input(s): CKTOTAL, CKMB, CKMBINDEX, TROPONINIHS in the last 72 hours. BNP: Recent Labs    10/24/23 0356  BNP 252.7*   D-Dimer: No results for input(s): DDIMER in the last 72 hours. Hemoglobin A1C: Recent Labs    10/24/23 0356  HGBA1C 5.7*   Fasting Lipid Panel: No results for input(s): CHOL, HDL, LDLCALC, TRIG, CHOLHDL, LDLDIRECT in the last 72 hours. Thyroid  Function Tests: No results for input(s): TSH, T4TOTAL, T3FREE, THYROIDAB in the last 72 hours.  Invalid input(s): FREET3 Anemia Panel: No results for input(s):  VITAMINB12, FOLATE, FERRITIN, TIBC, IRON, RETICCTPCT in the last 72 hours.   Radiology: ECHOCARDIOGRAM COMPLETE Result Date: 10/25/2023    ECHOCARDIOGRAM REPORT   Patient Name:   MARLIES LIGMAN Keegan Date of Exam: 10/25/2023 Medical Rec #:  [id number]            Height:       63.0 in Accession #:    [id number]           Weight:       220.0 lb Date of Birth:  [birth year]            BSA:          2.014 m Patient Age:    42 years             BP:           155/92 mmHg Patient Gender: F                    HR:           108 bpm. Exam Location:  [facility] Procedure: 2D Echo, Intracardiac Opacification Agent, Cardiac Doppler and Color            Doppler (Both Spectral and Color Flow Doppler were utilized during            procedure). Indications:     I50.20* Unspecified systolic (congestive) heart failure  History:         Patient has no prior history of Echocardiogram examinations.                  CHF, Signs/Symptoms:Shortness of Breath and Dyspnea; Risk                  Factors:Current Smoker.  Sonographer:     Doyal Point MHA, BS, RDCS Referring Phys:  [id number] MURVIN MANA Diagnosing Phys: Evalene Lunger MD  Sonographer Comments: Technically difficult study due to poor echo windows and patient is obese. Image acquisition challenging due to patient body habitus. IMPRESSIONS  1.  Left ventricular ejection fraction, by estimation, is 35 to 40%. The left ventricle has moderately decreased function. The left ventricle demonstrates global hypokinesis. The left ventricular internal cavity size was mildly dilated. There is mild left ventricular hypertrophy. Left ventricular diastolic parameters are indeterminate.  2. Right ventricular systolic function is normal. The right ventricular size is normal.  3. Left atrial size was mild to moderately dilated.  4. The mitral valve is normal in structure. Mild to moderate mitral valve regurgitation. No evidence of mitral stenosis.  5. The aortic valve is normal in structure.  Aortic valve regurgitation is not visualized. No aortic stenosis is present.  6. The inferior vena cava is normal in size with greater than 50% respiratory variability, suggesting right atrial pressure of 3 mmHg. FINDINGS  Left Ventricle: Left ventricular ejection fraction, by estimation, is 35 to 40%. The left ventricle has moderately decreased function. The left ventricle demonstrates global hypokinesis. Definity  contrast agent was given IV to delineate the left ventricular endocardial borders. Strain was performed and the global longitudinal strain is indeterminate. The left ventricular internal cavity size was mildly dilated. There is mild left ventricular hypertrophy. Left ventricular diastolic parameters are  indeterminate. Right Ventricle: The right ventricular size is normal. No increase in right ventricular wall thickness. Right ventricular systolic function is normal. Left Atrium: Left atrial size was mild to moderately dilated. Right Atrium: Right atrial size was normal in size. Pericardium: There is no evidence of pericardial effusion. Mitral Valve: The mitral valve is normal in structure. Mild to moderate mitral valve regurgitation. No evidence of mitral valve stenosis. Tricuspid Valve: The tricuspid valve is normal in structure. Tricuspid valve regurgitation is mild . No evidence of tricuspid stenosis. Aortic Valve: The aortic valve is normal in structure. Aortic valve regurgitation is not visualized. No aortic stenosis is present. Aortic valve mean gradient measures 5.5 mmHg. Aortic valve peak gradient measures 9.7 mmHg. Aortic valve area, by VTI measures 2.52 cm. Pulmonic Valve: The pulmonic valve was normal in structure. Pulmonic valve regurgitation is not visualized. No evidence of pulmonic stenosis. Aorta: The aortic root is normal in size and structure. Venous: The inferior vena cava is normal in size with greater than 50% respiratory variability, suggesting right atrial pressure of 3 mmHg.  IAS/Shunts: No atrial level shunt detected by color flow Doppler. Additional Comments: 3D was performed not requiring image post processing on an independent workstation and was indeterminate.  LEFT VENTRICLE PLAX 2D LVIDd:         5.10 cm LVIDs:         4.60 cm LV PW:         1.20 cm LV IVS:        1.10 cm LVOT diam:     2.10 cm LV SV:         61 LV SV Index:   30 LVOT Area:     3.46 cm  RIGHT VENTRICLE RV S prime:     12.10 cm/s TAPSE (M-mode): 2.9 cm LEFT ATRIUM              Index LA diam:        5.50 cm  2.73 cm/m LA Vol (A2C):   101.0 ml 50.16 ml/m LA Vol (A4C):   79.6 ml  39.53 ml/m LA Biplane Vol: 91.7 ml  45.54 ml/m  AORTIC VALVE AV Area (Vmax):    2.41 cm AV Area (Vmean):   2.41 cm AV Area (VTI):     2.52 cm AV Vmax:  155.50 cm/s AV Vmean:          106.050 cm/s AV VTI:            0.242 m AV Peak Grad:      9.7 mmHg AV Mean Grad:      5.5 mmHg LVOT Vmax:         108.00 cm/s LVOT Vmean:        73.900 cm/s LVOT VTI:          0.176 m LVOT/AV VTI ratio: 0.73  AORTA Ao Root diam: 2.70 cm Ao Asc diam:  3.00 cm  SHUNTS Systemic VTI:  0.18 m Systemic Diam: 2.10 cm Evalene Lunger MD Electronically signed by Evalene Lunger MD Signature Date/Time: 10/25/2023/1:13:10 PM    Final    CT CHEST ABDOMEN PELVIS W CONTRAST Result Date: 10/23/2023 CLINICAL DATA:  Abdominal pain wrapping around abdomen into the back. Nausea and vomiting. Pain is worse than yesterday. Pleural effusion seen on right upper quadrant ultrasound EXAM: CT CHEST, ABDOMEN, AND PELVIS WITH CONTRAST TECHNIQUE: Multidetector CT imaging of the chest, abdomen and pelvis was performed following the standard protocol during bolus administration of intravenous contrast. RADIATION DOSE REDUCTION: This exam was performed according to the departmental dose-optimization program which includes automated exposure control, adjustment of the mA and/or kV according to patient size and/or use of iterative reconstruction technique. CONTRAST:   OMNIPAQUE  IOHEXOL  350 MG/ML SOLN COMPARISON:  Same day ultrasound; CTA chest 10/21/2023; CT 03/08/2023 and 03/09/2023 FINDINGS: CT CHEST FINDINGS Cardiovascular: Normal heart size. No pericardial effusion. Mild aortic atherosclerotic calcification. No aortic dissection. Mediastinum/Nodes: Trachea and esophagus are unremarkable. Decreased mediastinal and hilar adenopathy compared to 10/21/2023. For example a 1.4 cm pretracheal node previously measured 1.9 cm. Lungs/Pleura: Bilateral lower lobe airspace opacities compatible with pneumonia. Moderate right and small left pleural effusions, unchanged from prior. No pneumothorax. Musculoskeletal: No acute fracture. CT ABDOMEN PELVIS FINDINGS Hepatobiliary: Mild hepatic steatosis. Unremarkable gallbladder and biliary tree. Pancreas: Unremarkable. Spleen: Mild splenomegaly. Adrenals/Urinary Tract: Normal adrenal glands. No urinary calculi or hydronephrosis. Unremarkable bladder. Stomach/Bowel: No bowel obstruction. Normal appendix. Stomach is within normal limits. Marked wall thickening and mucosal hyperenhancement of the distal ileum with adjacent vascular engorgement and fat stranding. Vascular/Lymphatic: No significant vascular findings are present. Numerous borderline enlarged mesenteric lymph nodes are likely reactive. Reproductive: Lobular contour of the enlarged uterus likely due to fibroids. No adnexal mass. Other: Small volume abdominopelvic ascites. No free intraperitoneal air. No abscess. Musculoskeletal: No acute fracture. IMPRESSION: CHEST: 1. Bilateral lower lobe pneumonia. 2. Moderate right and small left pleural effusions, unchanged from prior. 3. Decreased mediastinal and hilar adenopathy compared to 10/21/2023. ABDOMEN/PELVIS: 1. Inflammation of the distal ileum. Correlate for history of Crohn's disease. 2. Mild hepatic steatosis. 3. Mild splenomegaly. 4. Small volume abdominopelvic ascites. 5. Fibroid uterus. 6. Aortic Atherosclerosis (ICD10-I70.0).  Electronically Signed   By: Norman Gatlin M.D.   On: 10/23/2023 02:54   US  ABDOMEN LIMITED RUQ (LIVER/GB) Result Date: 10/23/2023 CLINICAL DATA:  Upper abdominal pain, nausea, and vomiting since yesterday. EXAM: ULTRASOUND ABDOMEN LIMITED RIGHT UPPER QUADRANT COMPARISON:  CTA abdomen 03/09/2023. FINDINGS: Gallbladder: There is no gallbladder wall thickening or positive sonographic Murphy's sign. There are scattered echogenic gallbladder wall polyps largest is 3.4 mm. There is no pericholecystic fluid. Common bile duct: Diameter: 2.3 mm.  No intrahepatic bile duct dilatation. Liver: No focal lesion identified. Within normal limits in parenchymal echogenicity. Portal vein is patent on color Doppler imaging with normal direction of blood flow towards  the liver. Other: Minimal perihepatic ascites noted anteriorly. Small or moderate-sized right pleural effusion. IMPRESSION: 1. No evidence of cholelithiasis or acute cholecystitis. 2. Scattered echogenic gallbladder wall polyps largest is 3.4 mm. 3. Minimal perihepatic ascites. 4. Small or moderate-sized right pleural effusion. Electronically Signed   By: Francis Quam M.D.   On: 10/23/2023 01:43   CT Angio Chest PE W and/or Wo Contrast Result Date: 10/21/2023 CLINICAL DATA:  Chest pain short of breath EXAM: CT ANGIOGRAPHY CHEST WITH CONTRAST TECHNIQUE: Multidetector CT imaging of the chest was performed using the standard protocol during bolus administration of intravenous contrast. Multiplanar CT image reconstructions and MIPs were obtained to evaluate the vascular anatomy. RADIATION DOSE REDUCTION: This exam was performed according to the departmental dose-optimization program which includes automated exposure control, adjustment of the mA and/or kV according to patient size and/or use of iterative reconstruction technique. CONTRAST:  OMNIPAQUE  IOHEXOL  350 MG/ML SOLN COMPARISON:  Chest x-ray 10/21/2023 FINDINGS: Cardiovascular: Satisfactory opacification of  the pulmonary arteries to the segmental level. No evidence of pulmonary embolism. Nonaneurysmal aorta. Mild atherosclerosis. Mild coronary vascular calcification. Borderline cardiomegaly. No sizable pericardial effusion Mediastinum/Nodes: Patent trachea. No suspicious thyroid  mass. Mild mediastinal adenopathy. Right upper paratracheal nodes measuring up to 13 mm. Lower paratracheal nodes measuring up to 15 mm. Subcarinal lymph node measures 20 mm. Small bilateral hilar nodes measuring up to 10 mm on the right and 10 mm on the left. Esophagus within normal limits. Lungs/Pleura: Moderate bilateral pleural effusions. Mild smooth septal thickening. Partial lower lobe consolidations. Bilateral bronchial wall thickening. Upper Abdomen: No acute finding. Borderline to mild splenomegaly, spleen measuring 14.1 cm. Small volume ascites within the abdomen. Musculoskeletal: No acute osseous abnormality. Review of the MIP images confirms the above findings. IMPRESSION: 1. Negative for acute pulmonary embolus. 2. Borderline cardiomegaly with moderate pleural effusions and diffuse mild smooth septal thickening suggestive of edema 3. Bilateral bronchial wall thickening consistent with airways inflammatory process. Partial lower lobe consolidations could reflect atelectasis or pneumonia 4. Mild mediastinal and hilar adenopathy, indeterminate for reactive versus neoplastic process. Consider short interval CT follow-up in 3 months to reassess. 5. Borderline to mild splenomegaly. Small volume ascites within the abdomen. 6. Aortic atherosclerosis. Aortic Atherosclerosis (ICD10-I70.0). Electronically Signed   By: Luke Bun M.D.   On: 10/21/2023 21:17   DG Chest Portable 1 View Result Date: 10/21/2023 CLINICAL DATA:  Chest pain and shortness of breath for over 2 weeks. EXAM: PORTABLE CHEST 1 VIEW COMPARISON:  Portable chest 03/08/2023. FINDINGS: There are small bilateral pleural effusions on the right-greater-than-left. In the right  base there is overlying patchy atelectasis or consolidation. The remaining lungs are clear. The cardiomediastinal silhouette and vascular pattern are normal. There is calcification in the transverse aorta. Thoracic cage is intact. Overlying monitor wires. IMPRESSION: 1. Small bilateral pleural effusions, right-greater-than-left. 2. Patchy atelectasis or consolidation in the right base. 3. Aortic atherosclerosis. Electronically Signed   By: Francis Quam M.D.   On: 10/21/2023 20:05    ECHO as above  TELEMETRY reviewed by me 10/26/2023: not on tele  EKG reviewed by me: sinus tachycardia rate 106 bpm with non specific ST-T wave changes.   Data reviewed by me 10/26/2023: last 24h vitals tele labs imaging I/O ED provider note, admission H&P.  Principal Problem:   Sepsis due to undetermined organism [facility]) Active Problems:   Community acquired bilateral lower lobe pneumonia   Gastroenteritis   Class 2 obesity    ASSESSMENT AND PLAN:  Rexene Almer [place]  is a 43 y.o. female  with no known cardiac history, with a past medical history of migraine headaches who presented to the ED on 10/22/2023 for abdominal pain and SOB. Per CT chest revealed bilateral lower lobe consolidation and patient treated for PNA. CT abdomen also revealed gastroenteritis. During this  admission Echo revealed newly reduced EF 45-40%, global hypokinesis, mild LVH, mild to moderate MR. Patient denies history of CAD, MI, HF or AF. Cardiology was consulted for further evaluation of new onset HFrEF.  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 CTA with bilateral pleural effusions. LA within normal limits.Echo revealed newly reduced EF 35-40%, global hypokinesis, mild LVH, mild to moderate MR.  -Ordered cardiac telemetry monitor to assess for any arrhthymias with patients newly diagnosed mild to mod MR.  -Continue IV lasix  40 mg BID. Likely transition to PO tomorrow. Closely monitor renal function and UOP. -Ordered metoprolol  succinate  50 mg daily.  -Ordered losartan  50 mg daily. Consider Entresto prior to discharge (Copay $4).  -Ordered spironolactone  25 mg daily. -Will plan to initiate dapagliflozin  10 mg tomorrow if renal function allows (Copay $4) -Will continue to optimize GDMT as BP and renal function allows. -Plan for cMRI to further evaluate infiltrative disease -Plan for Coronary CTA to further evaluate CAD. -If unable to perform Coronary CTA due to elevated HR, will plan to do outpatient RHC/LHC.   # Hypertension Trops minimally elevated and flat 14 > 18. EKG with sinus tachycardia rate 106 bpm with non specific ST-T wave changes. A1C 5.7% -Lipid panel ordered. -Losartan , metoprolol  as stated above.  # Bilateral lower lobe PNA # Acute gastroenteritis -Management per primary team.    This patient's plan of care was discussed and created with Dr. Ammon and he is in agreement.  Signed: Dorene Comfort, PA-C  10/26/2023, 10:39 AM [facility] Cardiology

## 2023-10-26 NOTE — Telephone Encounter (Signed)
 Pharmacy Patient Advocate Encounter  Received notification from [facility] that Prior Authorization for Farxiga  10MG  tablets has been APPROVED from 10/26/2023 to 10/25/2024. Ran test claim, Copay is $4.00. This test claim was processed through [facility]- copay amounts may vary at other pharmacies due to pharmacy/plan contracts, or as the patient moves through the different stages of their insurance plan.   PA #/Case ID/Reference #: [id number] Key: Kendra Clements

## 2023-10-26 NOTE — Telephone Encounter (Signed)
 Patient Product/process development scientist completed.    The patient is insured through [facility].     Ran test claim for Entresto 24-26 mg and the current 30 day co-pay is $4.00.   This test claim was processed through [facility]- copay amounts may vary at other pharmacies due to pharmacy/plan contracts, or as the patient moves through the different stages of their insurance plan.     Reyes Sharps, CPHT Pharmacy Technician III Certified Patient Advocate [facility] Pharmacy Patient Advocate Team Direct Number: [phone]  Fax: [phone]

## 2023-10-26 NOTE — Progress Notes (Signed)
 Progress Note   Patient: Kendra Clements FMW:[id number] DOB: [birth year] DOA: 10/22/2023     3 DOS: the patient was seen and examined on 10/26/2023   Brief hospital course: Kendra Clements is a 43 y.o. female with medical history significant of migraine headaches, otherwise healthy came to the hospital with abdominal pain and shortness of breath. Symptoms started about 2 days ago, she had some short of breath, nonproductive cough.  Repeated CT scan showed bilateral lower lobe consolidation, CT abdomen pelvis showed inflammation changes in the distal ileum  Patient was started on antibiotics with Unasyn  and doxycycline  for pneumonia and colitis.  Patient was also given Lasix  for volume overload.  Echocardiogram showed ejection fraction 35 to 40%.  Cardiology evaluation obtained.   Principal Problem:   Sepsis due to undetermined organism [facility]) Active Problems:   Community acquired bilateral lower lobe pneumonia   Gastroenteritis   Class 2 obesity   Assessment and Plan: Sepsis secondary to pneumonia Bilateral lower lobe pneumonia. Patient met sepsis criteria at time of admission with heart rate and respiratory rate plus mild leukocytosis.  This is secondary to bilateral lower lobe pneumonia. Pneumonia could be due to aspiration from nausea vomiting, alternatively, she may have had a viral infection 2 days ago, then developed secondary pneumonia afterwards. Blood culture so far has no growth for 2 days, antibiotics is continued with Unasyn  and doxycycline . Also started on DuoNeb as needed. Will complete 5 days of antibiotics.   Persistently short of breath. Minimal elevation in troponin. Acute systolic congestive heart failure. She is still complaining short of breath after receiving antibiotic treatment.  DuoNeb did not seem to be effective.  CT scan of the chest that admission showed bilateral pleural effusion.  I will check the BNP level, which was 252, which is high with  patient's significant obesity. Reviewed patient schedule admission, had some nonspecific ST changes.  Patient received the 40 mg IV Lasix , she was diuresing well, short of breath appeared to be improving.  Echocardiogram showed ejection fraction 35 to 40%, patient does not have a known CHF.  Etiology including acute congestive heart failure due to myocarditis versus acute on chronic systolic congestive heart failure.  Cardiology has ordered CT coronary angiogram, we will follow-up on results.  Continue IV Lasix .   Acute gastroenteritis. Possible Crohn disease. Patient does not having chronic abdominal pain or diarrhea.  Changes on CT scan may reflect gastroenteritis.  She is on Unasyn , will continue. Patient be referred to GI after discharge from hospital. Patient no longer has any diarrhea, feels constipated.  MiraLAX  started.  Having daily bowel movements.  Continue finishing up 5 days Unasyn .   Class II obesity. Diet and exercise advised.   Migraine headache. Continue home medicines.           Subjective:  Patient still has short of breath, but overall has been feeling better  Physical Exam: Vitals:   10/25/23 1557 10/25/23 2028 10/26/23 0432 10/26/23 0810  BP: (!) 165/98 (!) 157/114 (!) 142/82 (!) 173/103  Pulse: (!) 109 (!) 109 (!) 102 (!) 101  Resp: 18   20  Temp: 97.8 F (36.6 C) (!) 97.5 F (36.4 C) 97.7 F (36.5 C) (!) 97.5 F (36.4 C)  TempSrc: Oral Oral Oral Oral  SpO2: 94% 90% 90% 96%  Weight:      Height:       General exam: Appears calm and comfortable, obese Respiratory system: Clear to auscultation. Respiratory effort normal. Cardiovascular system: S1 &  S2 heard, RRR. No JVD, murmurs, rubs, gallops or clicks. No pedal edema. Gastrointestinal system: Abdomen is nondistended, soft and nontender. No organomegaly or masses felt. Normal bowel sounds heard. Central nervous system: Alert and oriented. No focal neurological deficits. Extremities: Symmetric 5 x 5  power. Skin: No rashes, lesions or ulcers Psychiatry: Judgement and insight appear normal. Mood & affect appropriate.    Data Reviewed:  Reviewed echocardiogram results and lab results.  Family Communication: Boyfriend updated at bedside  Disposition: Status is: Inpatient Remains inpatient appropriate because: Severity of disease, IV treatment     Time spent: 50  minutes  Author: Murvin Mana, MD 10/26/2023 11:38 AM  For on call review [email or web address].

## 2023-10-27 ENCOUNTER — Other Ambulatory Visit: Payer: Self-pay

## 2023-10-27 DIAGNOSIS — A419 Sepsis, unspecified organism: Secondary | ICD-10-CM | POA: Diagnosis not present

## 2023-10-27 DIAGNOSIS — J189 Pneumonia, unspecified organism: Secondary | ICD-10-CM | POA: Diagnosis not present

## 2023-10-27 DIAGNOSIS — I5021 Acute systolic (congestive) heart failure: Secondary | ICD-10-CM | POA: Diagnosis not present

## 2023-10-27 LAB — BASIC METABOLIC PANEL WITH GFR
Anion gap: 11 (ref 5–15)
BUN: 19 mg/dL (ref 6–20)
CO2: 28 mmol/L (ref 22–32)
Calcium: 9.4 mg/dL (ref 8.9–10.3)
Chloride: 101 mmol/L (ref 98–111)
Creatinine, Ser: 0.66 mg/dL (ref 0.44–1.00)
GFR, Estimated: >60 mL/min (ref >60)
Glucose, Bld: 106 mg/dL — ABNORMAL HIGH (ref 70–99)
Potassium: 3.9 mmol/L (ref 3.5–5.1)
Sodium: 140 mmol/L (ref 135–145)

## 2023-10-27 LAB — CBC
HCT: 39.4 % (ref 36.0–46.0)
Hemoglobin: 12.1 g/dL (ref 12.0–15.0)
MCH: 24.2 pg — ABNORMAL LOW (ref 26.0–34.0)
MCHC: 30.7 g/dL (ref 30.0–36.0)
MCV: 78.6 fL — ABNORMAL LOW (ref 80.0–100.0)
Platelets: 308 K/uL (ref 150–400)
RBC: 5.01 MIL/uL (ref 3.87–5.11)
RDW: 15.6 % — ABNORMAL HIGH (ref 11.5–15.5)
WBC: 8.5 K/uL (ref 4.0–10.5)
nRBC: 0 % (ref 0.0–0.2)

## 2023-10-27 LAB — MAGNESIUM: Magnesium: 2 mg/dL (ref 1.7–2.4)

## 2023-10-27 MED ORDER — LOSARTAN POTASSIUM 50 MG PO TABS
50.0000 mg | ORAL_TABLET | Freq: Every day | ORAL | 0 refills | Status: AC
  Filled 2023-10-27: qty 30, 30d supply, fill #0

## 2023-10-27 MED ORDER — ATORVASTATIN CALCIUM 40 MG PO TABS
40.0000 mg | ORAL_TABLET | Freq: Every day | ORAL | 0 refills | Status: AC
  Filled 2023-10-27: qty 30, 30d supply, fill #0

## 2023-10-27 MED ORDER — AMOXICILLIN-POT CLAVULANATE 875-125 MG PO TABS: 1.0000 | ORAL_TABLET | Freq: Two times a day (BID) | ORAL | Status: AC

## 2023-10-27 MED ORDER — DOXYCYCLINE HYCLATE 100 MG PO TABS
100.0000 mg | ORAL_TABLET | Freq: Two times a day (BID) | ORAL | 0 refills | Status: AC
  Filled 2023-10-27: qty 2, 1d supply, fill #0

## 2023-10-27 MED ORDER — SPIRONOLACTONE 25 MG PO TABS
25.0000 mg | ORAL_TABLET | Freq: Every day | ORAL | 0 refills | Status: AC
  Filled 2023-10-27: qty 30, 30d supply, fill #0

## 2023-10-27 MED ORDER — DAPAGLIFLOZIN PROPANEDIOL 10 MG PO TABS
10.0000 mg | ORAL_TABLET | Freq: Every day | ORAL | 0 refills | Status: AC
  Filled 2023-10-27: qty 30, 30d supply, fill #0

## 2023-10-27 MED ORDER — PANTOPRAZOLE SODIUM 40 MG PO TBEC
40.0000 mg | DELAYED_RELEASE_TABLET | Freq: Two times a day (BID) | ORAL | 0 refills | Status: AC
  Filled 2023-10-27: qty 28, 14d supply, fill #0

## 2023-10-27 MED ORDER — DAPAGLIFLOZIN PROPANEDIOL 10 MG PO TABS
10.0000 mg | ORAL_TABLET | Freq: Every day | ORAL | Status: DC
  Administered 2023-10-27 (×2): 10 mg via ORAL
  Filled 2023-10-27: qty 1

## 2023-10-27 MED ORDER — FUROSEMIDE 40 MG PO TABS
40.0000 mg | ORAL_TABLET | Freq: Every day | ORAL | Status: DC
  Administered 2023-10-27 (×2): 40 mg via ORAL
  Filled 2023-10-27: qty 1

## 2023-10-27 MED ORDER — METOPROLOL SUCCINATE ER 50 MG PO TB24
50.0000 mg | ORAL_TABLET | Freq: Every day | ORAL | 0 refills | Status: AC
  Filled 2023-10-27: qty 30, 30d supply, fill #0

## 2023-10-27 MED ORDER — FUROSEMIDE 40 MG PO TABS
40.0000 mg | ORAL_TABLET | Freq: Every day | ORAL | 0 refills | Status: DC
  Filled 2023-10-27: qty 30, 30d supply, fill #0

## 2023-10-27 NOTE — Progress Notes (Signed)
 [facility] CLINIC CARDIOLOGY PROGRESS NOTE       Patient ID: Kendra Clements MRN: [id number] DOB/AGE: [birth year] 43 y.o.  Admit date: 10/22/2023 Referring Physician Dr. Laurita Primary Physician Pcp, No Primary Cardiologist None Reason for Consultation New onset HFrEF  HPI: Kendra Clements is a 43 y.o. female  with no known cardiac history, with a past medical history of migraine headaches who presented to the ED on 10/22/2023 for abdominal pain and SOB. Per CT chest revealed bilateral lower lobe consolidation and patient treated for PNA. CT abdomen also revealed gastroenteritis. During this  admission Echo revealed newly reduced EF 45-40%, global hypokinesis, mild LVH, mild to moderate MR. Patient denies history of CAD, MI, HF or AF. Cardiology was consulted for further evaluation of new onset HFrEF.  Interval History: -Patient seen and examined this AM and sitting up in bedside chair. Patient states she feels better states breathing is much improved and denies chest pain. Patient is eager to get home. Appears near euvolemic  -Patients BP improving and HR elevated this AM. Overnight Tele showed no significant events.  -No documented UOP. Pt reports good UOp, with stable renal function. -Patient remains on room air with stable SpO2.     Review of systems complete and found to be negative unless listed above    Past Medical History:  Diagnosis Date   Family history of ovarian cancer 09/2017   cancer genetic testing letter sent   Migraines    Thyroid  disease     Past Surgical History:  Procedure Laterality Date   CESAREAN SECTION     MOUTH SURGERY     SHOULDER ARTHROSCOPY     SHOULDER SURGERY      Medications Prior to Admission  Medication Sig Dispense Refill Last Dose/Taking   albuterol  (VENTOLIN  HFA) 108 (90 Base) MCG/ACT inhaler Inhale 2 puffs into the lungs every 6 (six) hours as needed for wheezing or shortness of breath. 8 g 0 10/22/2023 at  2:00 PM    amoxicillin -clavulanate (AUGMENTIN ) 875-125 MG tablet Take 1 tablet by mouth 2 (two) times daily for 7 days. 14 tablet 0 10/22/2023 at  2:00 PM   [EXPIRED] azithromycin  (ZITHROMAX ) 500 MG tablet Take 1 tablet (500 mg total) by mouth daily for 3 days. 3 tablet 0 10/22/2023 at  2:00 PM   ondansetron  (ZOFRAN ) 4 MG tablet Take 1 tablet (4 mg total) by mouth every 8 (eight) hours as needed for nausea or vomiting. (Patient not taking: Reported on 03/09/2023) 20 tablet 0 Not Taking   SUMAtriptan  (IMITREX ) 50 MG tablet Take 1 tablet (50 mg total) by mouth every 2 (two) hours as needed for migraine. May repeat in 2 hours if headache persists or recurs. (Patient not taking: Reported on 03/09/2023) 10 tablet 0 Not Taking   Social History   Socioeconomic History   Marital status: Single    Spouse name: Not on file   Number of children: Not on file   Years of education: Not on file   Highest education level: Not on file  Occupational History   Not on file  Tobacco Use   Smoking status: Every Day    Current packs/day: 1.00    Types: Cigarettes   Smokeless tobacco: Never  Vaping Use   Vaping status: Never Used  Substance and Sexual Activity   Alcohol use: Yes    Comment: occasionally   Drug use: No   Sexual activity: Yes    Birth control/protection: None  Other Topics Concern  Not on file  Social History Narrative   ** Merged History Encounter **       Social Drivers of Health   Financial Resource Strain: Not on file  Food Insecurity: No Food Insecurity (10/23/2023)   Hunger Vital Sign    Worried About Running Out of Food in the Last Year: Never true    Ran Out of Food in the Last Year: Never true  Transportation Needs: No Transportation Needs (10/23/2023)   PRAPARE - Administrator, Civil Service (Medical): No    Lack of Transportation (Non-Medical): No  Physical Activity: Not on file  Stress: Not on file  Social Connections: Socially Isolated (10/23/2023)   Social Connection and  Isolation Panel    Frequency of Communication with Friends and Family: Three times a week    Frequency of Social Gatherings with Friends and Family: Twice a week    Attends Religious Services: Never    Database administrator or Organizations: No    Attends Banker Meetings: Never    Marital Status: Never married  Intimate Partner Violence: Not At Risk (10/23/2023)   Humiliation, Afraid, Rape, and Kick questionnaire    Fear of Current or Ex-Partner: No    Emotionally Abused: No    Physically Abused: No    Sexually Abused: No    Family History  Problem Relation Age of Onset   Hypertension Mother    Ovarian cancer Mother 72   Hypothyroidism Maternal Grandmother    COPD Paternal Grandmother    Breast cancer Cousin 40   Ovarian cancer Cousin 40   COPD Cousin    Hypothyroidism Maternal Uncle      Vitals:   10/26/23 2031 10/27/23 0011 10/27/23 0509 10/27/23 0820  BP: 137/89 (!) 154/103 137/89 (!) 168/104  Pulse: 94 100 97 100  Resp: 20 18 (!) 22 16  Temp: 98.2 F (36.8 C) 98 F (36.7 C) 97.9 F (36.6 C) 97.9 F (36.6 C)  TempSrc:      SpO2: 93% 98% 91% 98%  Weight:      Height:        PHYSICAL EXAM General: Well appearing female, well nourished, in no acute distress. HEENT: Normocephalic and atraumatic. Neck: No JVD.   Lungs: Normal respiratory effort on room air. Clear bilaterally to auscultation. No wheezes, crackles, rhonchi.  Heart: HRRR. Normal S1 and S2 without gallops or murmurs.  Abdomen: Non-distended appearing.  Msk: Normal strength and tone for age. Extremities: Warm and well perfused. No clubbing, cyanosis, edema.  Neuro: Alert and oriented X 3. Psych: Answers questions appropriately.   Labs: Basic Metabolic Panel: Recent Labs    10/25/23 0531 10/27/23 0543  NA 140 140  K 3.8 3.9  CL 104 101  CO2 27 28  GLUCOSE 126* 106*  BUN 15 19  CREATININE 0.47 0.66  CALCIUM  8.7* 9.4  MG 1.7 2.0   Liver Function Tests: No results for input(s):  AST, ALT, ALKPHOS, BILITOT, PROT, ALBUMIN in the last 72 hours.  No results for input(s): LIPASE, AMYLASE in the last 72 hours. CBC: Recent Labs    10/27/23 0543  WBC 8.5  HGB 12.1  HCT 39.4  MCV 78.6*  PLT 308   Cardiac Enzymes: No results for input(s): CKTOTAL, CKMB, CKMBINDEX, TROPONINIHS in the last 72 hours. BNP: No results for input(s): BNP in the last 72 hours.  D-Dimer: No results for input(s): DDIMER in the last 72 hours. Hemoglobin A1C: No results for input(s): HGBA1C  in the last 72 hours.  Fasting Lipid Panel: Recent Labs    10/26/23 1030  CHOL 181  HDL 37*  LDLCALC 113*  TRIG 156*  CHOLHDL 4.9   Thyroid  Function Tests: No results for input(s): TSH, T4TOTAL, T3FREE, THYROIDAB in the last 72 hours.  Invalid input(s): FREET3 Anemia Panel: No results for input(s): VITAMINB12, FOLATE, FERRITIN, TIBC, IRON, RETICCTPCT in the last 72 hours.   Radiology: MR CARDIAC MORPHOLOGY W WO CONTRAST Result Date: 10/26/2023 CLINICAL DATA:  Cardiomyopathy EXAM: CARDIAC MRI TECHNIQUE: The patient was scanned on a 1.5 Tesla Siemens magnet. A dedicated cardiac coil was used. Functional imaging was done using Fiesta sequences. 2,3, and 4 chamber views were done to assess for RWMA's. Modified Simpson's rule using a short axis stack was used to calculate an ejection fraction on a dedicated work Research officer, trade union. The patient received 13 cc of Gadavist . After 10 minutes inversion recovery sequences were used to assess for infiltration and scar tissue. Velocity flow mapping performed in the ascending aorta and main pulmonary artery. CONTRAST:  13 cc  of Gadavist  FINDINGS: 1. Normal left ventricular size and thickness. Moderately reduced LV systolic function (LVEF = 37%). There is global hypokinesis. There is no late gadolinium enhancement in the left ventricular myocardium. LVEDV: 234 ml LVESV: 147 ml SV: 88 ml CO: 8 L/min  Myocardial mass: 152g LV native T1 value 1013 ms (normal <1000 ms) LV ECV value 32% (normal <30%) 2. Normal right ventricular size, thickness and systolic function (RVEF = 55%). There are no regional wall motion abnormalities. 3.  Normal left and right atrial size. 4. Normal size of the aortic root, ascending aorta and pulmonary artery. 5. Mild mitral regurgitation, no significant valvular abnormalities. 6.  Normal pericardium.  No pericardial effusion. IMPRESSION: 1. Normal LV size, moderately reduced LV systolic function. LVEF 37%. 2.  No LGE or scar. 3.  No evidence for infiltrative myocardial disease. 4.  Normal RV size and function. 5.  Etiology for cardiomyopathy appears non ischemic. Electronically Signed   By: Redell Cave M.D.   On: 10/26/2023 17:01   MR CARDIAC VELOCITY FLOW MAP Result Date: 10/26/2023 CLINICAL DATA:  Cardiomyopathy EXAM: CARDIAC MRI TECHNIQUE: The patient was scanned on a 1.5 Tesla Siemens magnet. A dedicated cardiac coil was used. Functional imaging was done using Fiesta sequences. 2,3, and 4 chamber views were done to assess for RWMA's. Modified Simpson's rule using a short axis stack was used to calculate an ejection fraction on a dedicated work Research officer, trade union. The patient received 13 cc of Gadavist . After 10 minutes inversion recovery sequences were used to assess for infiltration and scar tissue. Velocity flow mapping performed in the ascending aorta and main pulmonary artery. CONTRAST:  13 cc  of Gadavist  FINDINGS: 1. Normal left ventricular size and thickness. Moderately reduced LV systolic function (LVEF = 37%). There is global hypokinesis. There is no late gadolinium enhancement in the left ventricular myocardium. LVEDV: 234 ml LVESV: 147 ml SV: 88 ml CO: 8 L/min Myocardial mass: 152g LV native T1 value 1013 ms (normal <1000 ms) LV ECV value 32% (normal <30%) 2. Normal right ventricular size, thickness and systolic function (RVEF = 55%). There are no  regional wall motion abnormalities. 3.  Normal left and right atrial size. 4. Normal size of the aortic root, ascending aorta and pulmonary artery. 5. Mild mitral regurgitation, no significant valvular abnormalities. 6.  Normal pericardium.  No pericardial effusion. IMPRESSION: 1. Normal LV size, moderately reduced  LV systolic function. LVEF 37%. 2.  No LGE or scar. 3.  No evidence for infiltrative myocardial disease. 4.  Normal RV size and function. 5.  Etiology for cardiomyopathy appears non ischemic. Electronically Signed   By: Redell Cave M.D.   On: 10/26/2023 17:01   MR CARDIAC VELOCITY FLOW MAP Result Date: 10/26/2023 CLINICAL DATA:  Cardiomyopathy EXAM: CARDIAC MRI TECHNIQUE: The patient was scanned on a 1.5 Tesla Siemens magnet. A dedicated cardiac coil was used. Functional imaging was done using Fiesta sequences. 2,3, and 4 chamber views were done to assess for RWMA's. Modified Simpson's rule using a short axis stack was used to calculate an ejection fraction on a dedicated work Research officer, trade union. The patient received 13 cc of Gadavist . After 10 minutes inversion recovery sequences were used to assess for infiltration and scar tissue. Velocity flow mapping performed in the ascending aorta and main pulmonary artery. CONTRAST:  13 cc  of Gadavist  FINDINGS: 1. Normal left ventricular size and thickness. Moderately reduced LV systolic function (LVEF = 37%). There is global hypokinesis. There is no late gadolinium enhancement in the left ventricular myocardium. LVEDV: 234 ml LVESV: 147 ml SV: 88 ml CO: 8 L/min Myocardial mass: 152g LV native T1 value 1013 ms (normal <1000 ms) LV ECV value 32% (normal <30%) 2. Normal right ventricular size, thickness and systolic function (RVEF = 55%). There are no regional wall motion abnormalities. 3.  Normal left and right atrial size. 4. Normal size of the aortic root, ascending aorta and pulmonary artery. 5. Mild mitral regurgitation, no significant  valvular abnormalities. 6.  Normal pericardium.  No pericardial effusion. IMPRESSION: 1. Normal LV size, moderately reduced LV systolic function. LVEF 37%. 2.  No LGE or scar. 3.  No evidence for infiltrative myocardial disease. 4.  Normal RV size and function. 5.  Etiology for cardiomyopathy appears non ischemic. Electronically Signed   By: Redell Cave M.D.   On: 10/26/2023 17:01   ECHOCARDIOGRAM COMPLETE Result Date: 10/25/2023    ECHOCARDIOGRAM REPORT   Patient Name:   LENAYA PIETSCH Sabina Date of Exam: 10/25/2023 Medical Rec #:  [id number]            Height:       63.0 in Accession #:    [id number]           Weight:       220.0 lb Date of Birth:  1980-05-30            BSA:          2.014 m Patient Age:    42 years             BP:           155/92 mmHg Patient Gender: F                    HR:           108 bpm. Exam Location:  [facility] Procedure: 2D Echo, Intracardiac Opacification Agent, Cardiac Doppler and Color            Doppler (Both Spectral and Color Flow Doppler were utilized during            procedure). Indications:     I50.20* Unspecified systolic (congestive) heart failure  History:         Patient has no prior history of Echocardiogram examinations.                  CHF,  Signs/Symptoms:Shortness of Breath and Dyspnea; Risk                  Factors:Current Smoker.  Sonographer:     Doyal Point MHA, BS, RDCS Referring Phys:  [id number] MURVIN MANA Diagnosing Phys: Evalene Lunger MD  Sonographer Comments: Technically difficult study due to poor echo windows and patient is obese. Image acquisition challenging due to patient body habitus. IMPRESSIONS  1. Left ventricular ejection fraction, by estimation, is 35 to 40%. The left ventricle has moderately decreased function. The left ventricle demonstrates global hypokinesis. The left ventricular internal cavity size was mildly dilated. There is mild left ventricular hypertrophy. Left ventricular diastolic parameters are indeterminate.  2. Right  ventricular systolic function is normal. The right ventricular size is normal.  3. Left atrial size was mild to moderately dilated.  4. The mitral valve is normal in structure. Mild to moderate mitral valve regurgitation. No evidence of mitral stenosis.  5. The aortic valve is normal in structure. Aortic valve regurgitation is not visualized. No aortic stenosis is present.  6. The inferior vena cava is normal in size with greater than 50% respiratory variability, suggesting right atrial pressure of 3 mmHg. FINDINGS  Left Ventricle: Left ventricular ejection fraction, by estimation, is 35 to 40%. The left ventricle has moderately decreased function. The left ventricle demonstrates global hypokinesis. Definity  contrast agent was given IV to delineate the left ventricular endocardial borders. Strain was performed and the global longitudinal strain is indeterminate. The left ventricular internal cavity size was mildly dilated. There is mild left ventricular hypertrophy. Left ventricular diastolic parameters are  indeterminate. Right Ventricle: The right ventricular size is normal. No increase in right ventricular wall thickness. Right ventricular systolic function is normal. Left Atrium: Left atrial size was mild to moderately dilated. Right Atrium: Right atrial size was normal in size. Pericardium: There is no evidence of pericardial effusion. Mitral Valve: The mitral valve is normal in structure. Mild to moderate mitral valve regurgitation. No evidence of mitral valve stenosis. Tricuspid Valve: The tricuspid valve is normal in structure. Tricuspid valve regurgitation is mild . No evidence of tricuspid stenosis. Aortic Valve: The aortic valve is normal in structure. Aortic valve regurgitation is not visualized. No aortic stenosis is present. Aortic valve mean gradient measures 5.5 mmHg. Aortic valve peak gradient measures 9.7 mmHg. Aortic valve area, by VTI measures 2.52 cm. Pulmonic Valve: The pulmonic valve was  normal in structure. Pulmonic valve regurgitation is not visualized. No evidence of pulmonic stenosis. Aorta: The aortic root is normal in size and structure. Venous: The inferior vena cava is normal in size with greater than 50% respiratory variability, suggesting right atrial pressure of 3 mmHg. IAS/Shunts: No atrial level shunt detected by color flow Doppler. Additional Comments: 3D was performed not requiring image post processing on an independent workstation and was indeterminate.  LEFT VENTRICLE PLAX 2D LVIDd:         5.10 cm LVIDs:         4.60 cm LV PW:         1.20 cm LV IVS:        1.10 cm LVOT diam:     2.10 cm LV SV:         61 LV SV Index:   30 LVOT Area:     3.46 cm  RIGHT VENTRICLE RV S prime:     12.10 cm/s TAPSE (M-mode): 2.9 cm LEFT ATRIUM  Index LA diam:        5.50 cm  2.73 cm/m LA Vol (A2C):   101.0 ml 50.16 ml/m LA Vol (A4C):   79.6 ml  39.53 ml/m LA Biplane Vol: 91.7 ml  45.54 ml/m  AORTIC VALVE AV Area (Vmax):    2.41 cm AV Area (Vmean):   2.41 cm AV Area (VTI):     2.52 cm AV Vmax:           155.50 cm/s AV Vmean:          106.050 cm/s AV VTI:            0.242 m AV Peak Grad:      9.7 mmHg AV Mean Grad:      5.5 mmHg LVOT Vmax:         108.00 cm/s LVOT Vmean:        73.900 cm/s LVOT VTI:          0.176 m LVOT/AV VTI ratio: 0.73  AORTA Ao Root diam: 2.70 cm Ao Asc diam:  3.00 cm  SHUNTS Systemic VTI:  0.18 m Systemic Diam: 2.10 cm Evalene Lunger MD Electronically signed by Evalene Lunger MD Signature Date/Time: 10/25/2023/1:13:10 PM    Final    CT CHEST ABDOMEN PELVIS W CONTRAST Result Date: 10/23/2023 CLINICAL DATA:  Abdominal pain wrapping around abdomen into the back. Nausea and vomiting. Pain is worse than yesterday. Pleural effusion seen on right upper quadrant ultrasound EXAM: CT CHEST, ABDOMEN, AND PELVIS WITH CONTRAST TECHNIQUE: Multidetector CT imaging of the chest, abdomen and pelvis was performed following the standard protocol during bolus administration of  intravenous contrast. RADIATION DOSE REDUCTION: This exam was performed according to the departmental dose-optimization program which includes automated exposure control, adjustment of the mA and/or kV according to patient size and/or use of iterative reconstruction technique. CONTRAST:  OMNIPAQUE  IOHEXOL  350 MG/ML SOLN COMPARISON:  Same day ultrasound; CTA chest 10/21/2023; CT 03/08/2023 and 03/09/2023 FINDINGS: CT CHEST FINDINGS Cardiovascular: Normal heart size. No pericardial effusion. Mild aortic atherosclerotic calcification. No aortic dissection. Mediastinum/Nodes: Trachea and esophagus are unremarkable. Decreased mediastinal and hilar adenopathy compared to 10/21/2023. For example a 1.4 cm pretracheal node previously measured 1.9 cm. Lungs/Pleura: Bilateral lower lobe airspace opacities compatible with pneumonia. Moderate right and small left pleural effusions, unchanged from prior. No pneumothorax. Musculoskeletal: No acute fracture. CT ABDOMEN PELVIS FINDINGS Hepatobiliary: Mild hepatic steatosis. Unremarkable gallbladder and biliary tree. Pancreas: Unremarkable. Spleen: Mild splenomegaly. Adrenals/Urinary Tract: Normal adrenal glands. No urinary calculi or hydronephrosis. Unremarkable bladder. Stomach/Bowel: No bowel obstruction. Normal appendix. Stomach is within normal limits. Marked wall thickening and mucosal hyperenhancement of the distal ileum with adjacent vascular engorgement and fat stranding. Vascular/Lymphatic: No significant vascular findings are present. Numerous borderline enlarged mesenteric lymph nodes are likely reactive. Reproductive: Lobular contour of the enlarged uterus likely due to fibroids. No adnexal mass. Other: Small volume abdominopelvic ascites. No free intraperitoneal air. No abscess. Musculoskeletal: No acute fracture. IMPRESSION: CHEST: 1. Bilateral lower lobe pneumonia. 2. Moderate right and small left pleural effusions, unchanged from prior. 3. Decreased mediastinal  and hilar adenopathy compared to 10/21/2023. ABDOMEN/PELVIS: 1. Inflammation of the distal ileum. Correlate for history of Crohn's disease. 2. Mild hepatic steatosis. 3. Mild splenomegaly. 4. Small volume abdominopelvic ascites. 5. Fibroid uterus. 6. Aortic Atherosclerosis (ICD10-I70.0). Electronically Signed   By: Norman Gatlin M.D.   On: 10/23/2023 02:54   US  ABDOMEN LIMITED RUQ (LIVER/GB) Result Date: 10/23/2023 CLINICAL DATA:  Upper abdominal pain, nausea, and vomiting  since yesterday. EXAM: ULTRASOUND ABDOMEN LIMITED RIGHT UPPER QUADRANT COMPARISON:  CTA abdomen 03/09/2023. FINDINGS: Gallbladder: There is no gallbladder wall thickening or positive sonographic Murphy's sign. There are scattered echogenic gallbladder wall polyps largest is 3.4 mm. There is no pericholecystic fluid. Common bile duct: Diameter: 2.3 mm.  No intrahepatic bile duct dilatation. Liver: No focal lesion identified. Within normal limits in parenchymal echogenicity. Portal vein is patent on color Doppler imaging with normal direction of blood flow towards the liver. Other: Minimal perihepatic ascites noted anteriorly. Small or moderate-sized right pleural effusion. IMPRESSION: 1. No evidence of cholelithiasis or acute cholecystitis. 2. Scattered echogenic gallbladder wall polyps largest is 3.4 mm. 3. Minimal perihepatic ascites. 4. Small or moderate-sized right pleural effusion. Electronically Signed   By: Francis Quam M.D.   On: 10/23/2023 01:43   CT Angio Chest PE W and/or Wo Contrast Result Date: 10/21/2023 CLINICAL DATA:  Chest pain short of breath EXAM: CT ANGIOGRAPHY CHEST WITH CONTRAST TECHNIQUE: Multidetector CT imaging of the chest was performed using the standard protocol during bolus administration of intravenous contrast. Multiplanar CT image reconstructions and MIPs were obtained to evaluate the vascular anatomy. RADIATION DOSE REDUCTION: This exam was performed according to the departmental dose-optimization program  which includes automated exposure control, adjustment of the mA and/or kV according to patient size and/or use of iterative reconstruction technique. CONTRAST:  OMNIPAQUE  IOHEXOL  350 MG/ML SOLN COMPARISON:  Chest x-ray 10/21/2023 FINDINGS: Cardiovascular: Satisfactory opacification of the pulmonary arteries to the segmental level. No evidence of pulmonary embolism. Nonaneurysmal aorta. Mild atherosclerosis. Mild coronary vascular calcification. Borderline cardiomegaly. No sizable pericardial effusion Mediastinum/Nodes: Patent trachea. No suspicious thyroid  mass. Mild mediastinal adenopathy. Right upper paratracheal nodes measuring up to 13 mm. Lower paratracheal nodes measuring up to 15 mm. Subcarinal lymph node measures 20 mm. Small bilateral hilar nodes measuring up to 10 mm on the right and 10 mm on the left. Esophagus within normal limits. Lungs/Pleura: Moderate bilateral pleural effusions. Mild smooth septal thickening. Partial lower lobe consolidations. Bilateral bronchial wall thickening. Upper Abdomen: No acute finding. Borderline to mild splenomegaly, spleen measuring 14.1 cm. Small volume ascites within the abdomen. Musculoskeletal: No acute osseous abnormality. Review of the MIP images confirms the above findings. IMPRESSION: 1. Negative for acute pulmonary embolus. 2. Borderline cardiomegaly with moderate pleural effusions and diffuse mild smooth septal thickening suggestive of edema 3. Bilateral bronchial wall thickening consistent with airways inflammatory process. Partial lower lobe consolidations could reflect atelectasis or pneumonia 4. Mild mediastinal and hilar adenopathy, indeterminate for reactive versus neoplastic process. Consider short interval CT follow-up in 3 months to reassess. 5. Borderline to mild splenomegaly. Small volume ascites within the abdomen. 6. Aortic atherosclerosis. Aortic Atherosclerosis (ICD10-I70.0). Electronically Signed   By: Luke Bun M.D.   On: 10/21/2023  21:17   DG Chest Portable 1 View Result Date: 10/21/2023 CLINICAL DATA:  Chest pain and shortness of breath for over 2 weeks. EXAM: PORTABLE CHEST 1 VIEW COMPARISON:  Portable chest 03/08/2023. FINDINGS: There are small bilateral pleural effusions on the right-greater-than-left. In the right base there is overlying patchy atelectasis or consolidation. The remaining lungs are clear. The cardiomediastinal silhouette and vascular pattern are normal. There is calcification in the transverse aorta. Thoracic cage is intact. Overlying monitor wires. IMPRESSION: 1. Small bilateral pleural effusions, right-greater-than-left. 2. Patchy atelectasis or consolidation in the right base. 3. Aortic atherosclerosis. Electronically Signed   By: Francis Quam M.D.   On: 10/21/2023 20:05    ECHO as above  TELEMETRY  reviewed by me 10/27/2023: sinus tachycardia, rate 100s  EKG reviewed by me: sinus tachycardia rate 106 bpm with non specific ST-T wave changes.   Data reviewed by me 10/27/2023: last 24h vitals tele labs imaging I/O hospitalist progress notes.  Principal Problem:   Sepsis due to undetermined organism [facility]) Active Problems:   Community acquired bilateral lower lobe pneumonia   Gastroenteritis   Class 2 obesity   Acute systolic (congestive) heart failure (HCC)    ASSESSMENT AND PLAN:  Kendra Clements is a 43 y.o. female  with no known cardiac history, with a past medical history of migraine headaches who presented to the ED on 10/22/2023 for abdominal pain and SOB. Per CT chest revealed bilateral lower lobe consolidation and patient treated for PNA. CT abdomen also revealed gastroenteritis. During this  admission Echo revealed newly reduced EF 45-40%, global hypokinesis, mild LVH, mild to moderate MR. Patient denies history of CAD, MI, HF or AF. Cardiology was consulted for further evaluation of new onset HFrEF.  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 CTA with bilateral pleural effusions. LA  within normal limits.Echo revealed newly reduced EF 35-40%, global hypokinesis, mild LVH, mild to moderate MR. cMRI revealed LVEF 37%, no LGE/scar, no evidence of infiltrative disease, states etiology for CM appears non-ischemic. Appears euvolemic on exam, SOB much improved.  -Transition IV to PO lasix  40 mg daily. Closely monitor renal function and UOP. -Continue metoprolol  succinate 50 mg daily.  -Continue losartan  50 mg daily. Consider Entresto at outpatient follow-up (Copay $4).  -Continue spironolactone  25 mg daily. -Ordered dapagliflozin  10 mg daily. (Copay $4) -Coronary CTA not performed because HR remained elevated during admission.  -Recommend outpatient RHC/LHC for further evaluation of newly reduced EF of 35-40%.  # Hypertension Trops minimally elevated and flat 14 > 18. EKG with sinus tachycardia rate 106 bpm with non specific ST-T wave changes. A1C 5.7%. Liid panel revealed LDL 113, TG 156.  -Ordered atorvastatin  40 mg daily.  -Losartan , metoprolol  as stated above.  # Bilateral lower lobe PNA # Acute gastroenteritis -Management per primary team.   Ok for discharge today from a cardiac perspective. Follow up in clinic with HF clinic on 08/19 at 11AM and with Dr. Ammon on 09/29 at 2:30 PM.   This patient's plan of care was discussed and created with Dr. Ammon and he is in agreement.  Signed: Dorene Comfort, PA-C  10/27/2023, 8:36 AM [facility]

## 2023-10-27 NOTE — Discharge Summary (Signed)
 Physician Discharge Summary   Patient: Kendra Clements MRN: [id number] DOB: 12-25-1980  Admit date:     10/22/2023  Discharge date: 10/27/23  Discharge Physician: Murvin Mana   PCP: Pcp, No   Recommendations at discharge:   Follow-up with cardiology as scheduled. Follow-up with PCP as soon as possible  Discharge Diagnoses: Principal Problem:   Sepsis due to undetermined organism [facility]) Active Problems:   Community acquired bilateral lower lobe pneumonia   Gastroenteritis   Class 2 obesity   Acute systolic (congestive) heart failure (HCC)  Resolved Problems:   * No resolved hospital problems. [facility] Course: Kendra Clements is a 43 y.o. female with medical history significant of migraine headaches, otherwise healthy came to the hospital with abdominal pain and shortness of breath. Symptoms started about 2 days ago, she had some short of breath, nonproductive cough.  Repeated CT scan showed bilateral lower lobe consolidation, CT abdomen pelvis showed inflammation changes in the distal ileum  Patient was started on antibiotics with Unasyn  and doxycycline  for pneumonia and colitis.  Patient was also given Lasix  for volume overload.  Echocardiogram showed ejection fraction 35 to 40%.  Cardiology evaluation obtained.  Improved, medically stable for discharge.  Patient will follow with cardiology for additional workup as outpatient.  Assessment and Plan:  Sepsis secondary to pneumonia Bilateral lower lobe pneumonia. Patient met sepsis criteria at time of admission with heart rate and respiratory rate plus mild leukocytosis.  This is secondary to bilateral lower lobe pneumonia. Pneumonia could be due to aspiration from nausea vomiting, alternatively, she may have had a viral infection 2 days ago, then developed secondary pneumonia afterwards. Blood culture so far has no growth for 2 days, antibiotics is continued with Unasyn  and doxycycline . Also started on DuoNeb as  needed. Will complete 5 days of antibiotics.  Patient only needed 1 more day of oral antibiotics.   Persistently short of breath. Minimal elevation in troponin. Acute systolic congestive heart failure. She is still complaining short of breath after receiving antibiotic treatment.  DuoNeb did not seem to be effective.  CT scan of the chest that admission showed bilateral pleural effusion.  I will check the BNP level, which was 252, which is high with patient's significant obesity. Reviewed patient schedule admission, had some nonspecific ST changes.  Patient received the 40 mg IV Lasix , she was diuresing well, short of breath appeared to be improving.  Echocardiogram showed ejection fraction 35 to 40%, patient does not have a known CHF.  Etiology including acute congestive heart failure due to myocarditis versus acute on chronic systolic congestive heart failure.  MRI scan of the heart did not see any infiltration.  Patient is scheduled to have outpatient CT coronary angiogram. Condition improved, short of breath resolved.  Medically stable for discharge.  GDMT started, will follow with cardiology to titrate dose.   Acute gastroenteritis. Possible Crohn disease. Patient does not having chronic abdominal pain or diarrhea.  Changes on CT scan may reflect gastroenteritis.  She is on Unasyn , will continue. Patient be referred to GI after discharge from hospital. Patient no longer has any diarrhea, feels constipated.  MiraLAX  started.  Having daily bowel movements.  Patient has improved.  Will schedule follow-up with GI   Class II obesity. BMI 38.97 Diet and exercise advised.   Migraine headache. Continue home medicines.         Consultants: Cardiology Procedures performed: None  Disposition: Home Diet recommendation:  Discharge Diet Orders (From admission, onward)  Start     Ordered   10/27/23 0000  Diet - low sodium heart healthy        10/27/23 1002           Cardiac  diet DISCHARGE MEDICATION: Allergies as of 10/27/2023       Reactions   Tramadol Nausea And Vomiting        Medication List     STOP taking these medications    azithromycin  500 MG tablet Commonly known as: Zithromax    ondansetron  4 MG tablet Commonly known as: Zofran    SUMAtriptan  50 MG tablet Commonly known as: Imitrex        TAKE these medications    albuterol  108 (90 Base) MCG/ACT inhaler Commonly known as: VENTOLIN  HFA Inhale 2 puffs into the lungs every 6 (six) hours as needed for wheezing or shortness of breath.   amoxicillin -clavulanate 875-125 MG tablet Commonly known as: AUGMENTIN  Take 1 tablet by mouth 2 (two) times daily for 1 day.   atorvastatin  40 MG tablet Commonly known as: LIPITOR Take 1 tablet (40 mg total) by mouth daily.   dapagliflozin  propanediol 10 MG Tabs tablet Commonly known as: FARXIGA  Take 1 tablet (10 mg total) by mouth daily.   doxycycline  100 MG tablet Commonly known as: VIBRA -TABS Take 1 tablet (100 mg total) by mouth every 12 (twelve) hours for 1 day.   furosemide  40 MG tablet Commonly known as: LASIX  Take 1 tablet (40 mg total) by mouth daily.   losartan  50 MG tablet Commonly known as: COZAAR  Take 1 tablet (50 mg total) by mouth daily.   metoprolol  succinate 50 MG 24 hr tablet Commonly known as: TOPROL -XL Take 1 tablet (50 mg total) by mouth daily. Take with or immediately following a meal.   pantoprazole  40 MG tablet Commonly known as: PROTONIX  Take 1 tablet (40 mg total) by mouth 2 (two) times daily before a meal for 14 days.   spironolactone  25 MG tablet Commonly known as: ALDACTONE  Take 1 tablet (25 mg total) by mouth daily.        Follow-up Information     Ammon Blunt, MD Follow up on 12/14/2023.   Specialty: Cardiology Why: 09/29 at 2:30 pm Contact information: [address] [facility] [place] [address] [phone]         [place], Caralyn, PA-C. Go on  11/03/2023.   Specialty: Cardiology Why: HF clinic on 08/19 at 11 AM Contact information: [address] [place] [address] [phone]                Discharge Exam: Fredricka Weights   10/22/23 2025  Weight: 99.8 kg   General exam: Appears calm and comfortable  Respiratory system: Clear to auscultation. Respiratory effort normal. Cardiovascular system: S1 & S2 heard, RRR. No JVD, murmurs, rubs, gallops or clicks. No pedal edema. Gastrointestinal system: Abdomen is nondistended, soft and nontender. No organomegaly or masses felt. Normal bowel sounds heard. Central nervous system: Alert and oriented. No focal neurological deficits. Extremities: Symmetric 5 x 5 power. Skin: No rashes, lesions or ulcers Psychiatry: Judgement and insight appear normal. Mood & affect appropriate.    Condition at discharge: good  The results of significant diagnostics from this hospitalization (including imaging, microbiology, ancillary and laboratory) are listed below for reference.   Imaging Studies: MR CARDIAC MORPHOLOGY W WO CONTRAST Result Date: 10/26/2023 CLINICAL DATA:  Cardiomyopathy EXAM: CARDIAC MRI TECHNIQUE: The patient was scanned on a 1.5 Tesla Siemens magnet. A dedicated cardiac coil was  used. Functional imaging was done using Fiesta sequences. 2,3, and 4 chamber views were done to assess for RWMA's. Modified Simpson's rule using a short axis stack was used to calculate an ejection fraction on a dedicated work [employer]. The patient received 13 cc of Gadavist . After 10 minutes inversion recovery sequences were used to assess for infiltration and scar tissue. Velocity flow mapping performed in the ascending aorta and main pulmonary artery. CONTRAST:  13 cc  of Gadavist  FINDINGS: 1. Normal left ventricular size and thickness. Moderately reduced LV systolic function (LVEF = 37%). There is global hypokinesis. There is no late gadolinium enhancement in the left  ventricular myocardium. LVEDV: 234 ml LVESV: 147 ml SV: 88 ml CO: 8 L/min Myocardial mass: 152g LV native T1 value 1013 ms (normal <1000 ms) LV ECV value 32% (normal <30%) 2. Normal right ventricular size, thickness and systolic function (RVEF = 55%). There are no regional wall motion abnormalities. 3.  Normal left and right atrial size. 4. Normal size of the aortic root, ascending aorta and pulmonary artery. 5. Mild mitral regurgitation, no significant valvular abnormalities. 6.  Normal pericardium.  No pericardial effusion. IMPRESSION: 1. Normal LV size, moderately reduced LV systolic function. LVEF 37%. 2.  No LGE or scar. 3.  No evidence for infiltrative myocardial disease. 4.  Normal RV size and function. 5.  Etiology for cardiomyopathy appears non ischemic. Electronically Signed   By: Redell Cave M.D.   On: 10/26/2023 17:01   MR CARDIAC VELOCITY FLOW MAP Result Date: 10/26/2023 CLINICAL DATA:  Cardiomyopathy EXAM: CARDIAC MRI TECHNIQUE: The patient was scanned on a 1.5 Tesla Siemens magnet. A dedicated cardiac coil was used. Functional imaging was done using Fiesta sequences. 2,3, and 4 chamber views were done to assess for RWMA's. Modified Simpson's rule using a short axis stack was used to calculate an ejection fraction on a dedicated work [employer], [employer]. The patient received 13 cc of Gadavist . After 10 minutes inversion recovery sequences were used to assess for infiltration and scar tissue. Velocity flow mapping performed in the ascending aorta and main pulmonary artery. CONTRAST:  13 cc  of Gadavist  FINDINGS: 1. Normal left ventricular size and thickness. Moderately reduced LV systolic function (LVEF = 37%). There is global hypokinesis. There is no late gadolinium enhancement in the left ventricular myocardium. LVEDV: 234 ml LVESV: 147 ml SV: 88 ml CO: 8 L/min Myocardial mass: 152g LV native T1 value 1013 ms (normal <1000 ms) LV ECV value 32% (normal <30%) 2. Normal right  ventricular size, thickness and systolic function (RVEF = 55%). There are no regional wall motion abnormalities. 3.  Normal left and right atrial size. 4. Normal size of the aortic root, ascending aorta and pulmonary artery. 5. Mild mitral regurgitation, no significant valvular abnormalities. 6.  Normal pericardium.  No pericardial effusion. IMPRESSION: 1. Normal LV size, moderately reduced LV systolic function. LVEF 37%. 2.  No LGE or scar. 3.  No evidence for infiltrative myocardial disease. 4.  Normal RV size and function. 5.  Etiology for cardiomyopathy appears non ischemic. Electronically Signed   By: Redell Cave M.D.   On: 10/26/2023 17:01   MR CARDIAC VELOCITY FLOW MAP Result Date: 10/26/2023 CLINICAL DATA:  Cardiomyopathy EXAM: CARDIAC MRI TECHNIQUE: The patient was scanned on a 1.5 Tesla Siemens magnet. A dedicated cardiac coil was used. Functional imaging was done using Fiesta sequences. 2,3, and 4 chamber views were done to assess for RWMA's. Modified Simpson's rule using a  short axis stack was used to calculate an ejection fraction on a dedicated work [employer], [employer]. The patient received 13 cc of Gadavist . After 10 minutes inversion recovery sequences were used to assess for infiltration and scar tissue. Velocity flow mapping performed in the ascending aorta and main pulmonary artery. CONTRAST:  13 cc  of Gadavist  FINDINGS: 1. Normal left ventricular size and thickness. Moderately reduced LV systolic function (LVEF = 37%). There is global hypokinesis. There is no late gadolinium enhancement in the left ventricular myocardium. LVEDV: 234 ml LVESV: 147 ml SV: 88 ml CO: 8 L/min Myocardial mass: 152g LV native T1 value 1013 ms (normal <1000 ms) LV ECV value 32% (normal <30%) 2. Normal right ventricular size, thickness and systolic function (RVEF = 55%). There are no regional wall motion abnormalities. 3.  Normal left and right atrial size. 4. Normal size of the aortic root, ascending  aorta and pulmonary artery. 5. Mild mitral regurgitation, no significant valvular abnormalities. 6.  Normal pericardium.  No pericardial effusion. IMPRESSION: 1. Normal LV size, moderately reduced LV systolic function. LVEF 37%. 2.  No LGE or scar. 3.  No evidence for infiltrative myocardial disease. 4.  Normal RV size and function. 5.  Etiology for cardiomyopathy appears non ischemic. Electronically Signed   By: Redell Cave M.D.   On: 10/26/2023 17:01   ECHOCARDIOGRAM COMPLETE Result Date: 10/25/2023    ECHOCARDIOGRAM REPORT   Patient Name:   Kendra Clements Date of Exam: 10/25/2023 Medical Rec #:  [id number]            Height:       63.0 in Accession #:    [id number]           Weight:       220.0 lb Date of Birth:  06-07-1980            BSA:          2.014 m Patient Age:    42 years             BP:           155/92 mmHg Patient Gender: F                    HR:           108 bpm. Exam Location:  [facility] Procedure: 2D Echo, Intracardiac Opacification Agent, Cardiac Doppler and Color            Doppler (Both Spectral and Color Flow Doppler were utilized during            procedure). Indications:     I50.20* Unspecified systolic (congestive) heart failure  History:         Patient has no prior history of Echocardiogram examinations.                  CHF, Signs/Symptoms:Shortness of Breath and Dyspnea; Risk                  Factors:Current Smoker.  Sonographer:     Doyal Point MHA, BS, RDCS Referring Phys:  [id number] MURVIN MANA Diagnosing Phys: Evalene Lunger MD  Sonographer Comments: Technically difficult study due to poor echo windows and patient is obese. Image acquisition challenging due to patient body habitus. IMPRESSIONS  1. Left ventricular ejection fraction, by estimation, is 35 to 40%. The left ventricle has moderately decreased function. The left ventricle demonstrates global hypokinesis. The left ventricular internal cavity size was mildly dilated.  There is mild left ventricular hypertrophy.  Left ventricular diastolic parameters are indeterminate.  2. Right ventricular systolic function is normal. The right ventricular size is normal.  3. Left atrial size was mild to moderately dilated.  4. The mitral valve is normal in structure. Mild to moderate mitral valve regurgitation. No evidence of mitral stenosis.  5. The aortic valve is normal in structure. Aortic valve regurgitation is not visualized. No aortic stenosis is present.  6. The inferior vena cava is normal in size with greater than 50% respiratory variability, suggesting right atrial pressure of 3 mmHg. FINDINGS  Left Ventricle: Left ventricular ejection fraction, by estimation, is 35 to 40%. The left ventricle has moderately decreased function. The left ventricle demonstrates global hypokinesis. Definity  contrast agent was given IV to delineate the left ventricular endocardial borders. Strain was performed and the global longitudinal strain is indeterminate. The left ventricular internal cavity size was mildly dilated. There is mild left ventricular hypertrophy. Left ventricular diastolic parameters are  indeterminate. Right Ventricle: The right ventricular size is normal. No increase in right ventricular wall thickness. Right ventricular systolic function is normal. Left Atrium: Left atrial size was mild to moderately dilated. Right Atrium: Right atrial size was normal in size. Pericardium: There is no evidence of pericardial effusion. Mitral Valve: The mitral valve is normal in structure. Mild to moderate mitral valve regurgitation. No evidence of mitral valve stenosis. Tricuspid Valve: The tricuspid valve is normal in structure. Tricuspid valve regurgitation is mild . No evidence of tricuspid stenosis. Aortic Valve: The aortic valve is normal in structure. Aortic valve regurgitation is not visualized. No aortic stenosis is present. Aortic valve mean gradient measures 5.5 mmHg. Aortic valve peak gradient measures 9.7 mmHg. Aortic valve area, by  VTI measures 2.52 cm. Pulmonic Valve: The pulmonic valve was normal in structure. Pulmonic valve regurgitation is not visualized. No evidence of pulmonic stenosis. Aorta: The aortic root is normal in size and structure. Venous: The inferior vena cava is normal in size with greater than 50% respiratory variability, suggesting right atrial pressure of 3 mmHg. IAS/Shunts: No atrial level shunt detected by color flow Doppler. Additional Comments: 3D was performed not requiring image post processing on an independent workstation and was indeterminate.  LEFT VENTRICLE PLAX 2D LVIDd:         5.10 cm LVIDs:         4.60 cm LV PW:         1.20 cm LV IVS:        1.10 cm LVOT diam:     2.10 cm LV SV:         61 LV SV Index:   30 LVOT Area:     3.46 cm  RIGHT VENTRICLE RV S prime:     12.10 cm/s TAPSE (M-mode): 2.9 cm LEFT ATRIUM              Index LA diam:        5.50 cm  2.73 cm/m LA Vol (A2C):   101.0 ml 50.16 ml/m LA Vol (A4C):   79.6 ml  39.53 ml/m LA Biplane Vol: 91.7 ml  45.54 ml/m  AORTIC VALVE AV Area (Vmax):    2.41 cm AV Area (Vmean):   2.41 cm AV Area (VTI):     2.52 cm AV Vmax:           155.50 cm/s AV Vmean:          106.050 cm/s AV VTI:  0.242 m AV Peak Grad:      9.7 mmHg AV Mean Grad:      5.5 mmHg LVOT Vmax:         108.00 cm/s LVOT Vmean:        73.900 cm/s LVOT VTI:          0.176 m LVOT/AV VTI ratio: 0.73  AORTA Ao Root diam: 2.70 cm Ao Asc diam:  3.00 cm  SHUNTS Systemic VTI:  0.18 m Systemic Diam: 2.10 cm Evalene Lunger MD Electronically signed by Evalene Lunger MD Signature Date/Time: 10/25/2023/1:13:10 PM    Final    CT CHEST ABDOMEN PELVIS W CONTRAST Result Date: 10/23/2023 CLINICAL DATA:  Abdominal pain wrapping around abdomen into the back. Nausea and vomiting. Pain is worse than yesterday. Pleural effusion seen on right upper quadrant ultrasound EXAM: CT CHEST, ABDOMEN, AND PELVIS WITH CONTRAST TECHNIQUE: Multidetector CT imaging of the chest, abdomen and pelvis was performed  following the standard protocol during bolus administration of intravenous contrast. RADIATION DOSE REDUCTION: This exam was performed according to the departmental dose-optimization program which includes automated exposure control, adjustment of the mA and/or kV according to patient size and/or use of iterative reconstruction technique. CONTRAST:  OMNIPAQUE  IOHEXOL  350 MG/ML SOLN COMPARISON:  Same day ultrasound; CTA chest 10/21/2023; CT 03/08/2023 and 03/09/2023 FINDINGS: CT CHEST FINDINGS Cardiovascular: Normal heart size. No pericardial effusion. Mild aortic atherosclerotic calcification. No aortic dissection. Mediastinum/Nodes: Trachea and esophagus are unremarkable. Decreased mediastinal and hilar adenopathy compared to 10/21/2023. For example a 1.4 cm pretracheal node previously measured 1.9 cm. Lungs/Pleura: Bilateral lower lobe airspace opacities compatible with pneumonia. Moderate right and small left pleural effusions, unchanged from prior. No pneumothorax. Musculoskeletal: No acute fracture. CT ABDOMEN PELVIS FINDINGS Hepatobiliary: Mild hepatic steatosis. Unremarkable gallbladder and biliary tree. Pancreas: Unremarkable. Spleen: Mild splenomegaly. Adrenals/Urinary Tract: Normal adrenal glands. No urinary calculi or hydronephrosis. Unremarkable bladder. Stomach/Bowel: No bowel obstruction. Normal appendix. Stomach is within normal limits. Marked wall thickening and mucosal hyperenhancement of the distal ileum with adjacent vascular engorgement and fat stranding. Vascular/Lymphatic: No significant vascular findings are present. Numerous borderline enlarged mesenteric lymph nodes are likely reactive. Reproductive: Lobular contour of the enlarged uterus likely due to fibroids. No adnexal mass. Other: Small volume abdominopelvic ascites. No free intraperitoneal air. No abscess. Musculoskeletal: No acute fracture. IMPRESSION: CHEST: 1. Bilateral lower lobe pneumonia. 2. Moderate right and small left  pleural effusions, unchanged from prior. 3. Decreased mediastinal and hilar adenopathy compared to 10/21/2023. ABDOMEN/PELVIS: 1. Inflammation of the distal ileum. Correlate for history of Crohn's disease. 2. Mild hepatic steatosis. 3. Mild splenomegaly. 4. Small volume abdominopelvic ascites. 5. Fibroid uterus. 6. Aortic Atherosclerosis (ICD10-I70.0). Electronically Signed   By: Norman Gatlin M.D.   On: 10/23/2023 02:54   US  ABDOMEN LIMITED RUQ (LIVER/GB) Result Date: 10/23/2023 CLINICAL DATA:  Upper abdominal pain, nausea, and vomiting since yesterday. EXAM: ULTRASOUND ABDOMEN LIMITED RIGHT UPPER QUADRANT COMPARISON:  CTA abdomen 03/09/2023. FINDINGS: Gallbladder: There is no gallbladder wall thickening or positive sonographic Murphy's sign. There are scattered echogenic gallbladder wall polyps largest is 3.4 mm. There is no pericholecystic fluid. Common bile duct: Diameter: 2.3 mm.  No intrahepatic bile duct dilatation. Liver: No focal lesion identified. Within normal limits in parenchymal echogenicity. Portal vein is patent on color Doppler imaging with normal direction of blood flow towards the liver. Other: Minimal perihepatic ascites noted anteriorly. Small or moderate-sized right pleural effusion. IMPRESSION: 1. No evidence of cholelithiasis or acute cholecystitis. 2. Scattered echogenic gallbladder wall  polyps largest is 3.4 mm. 3. Minimal perihepatic ascites. 4. Small or moderate-sized right pleural effusion. Electronically Signed   By: Francis Quam M.D.   On: 10/23/2023 01:43   CT Angio Chest PE W and/or Wo Contrast Result Date: 10/21/2023 CLINICAL DATA:  Chest pain short of breath EXAM: CT ANGIOGRAPHY CHEST WITH CONTRAST TECHNIQUE: Multidetector CT imaging of the chest was performed using the standard protocol during bolus administration of intravenous contrast. Multiplanar CT image reconstructions and MIPs were obtained to evaluate the vascular anatomy. RADIATION DOSE REDUCTION: This exam was  performed according to the departmental dose-optimization program which includes automated exposure control, adjustment of the mA and/or kV according to patient size and/or use of iterative reconstruction technique. CONTRAST:  OMNIPAQUE  IOHEXOL  350 MG/ML SOLN COMPARISON:  Chest x-ray 10/21/2023 FINDINGS: Cardiovascular: Satisfactory opacification of the pulmonary arteries to the segmental level. No evidence of pulmonary embolism. Nonaneurysmal aorta. Mild atherosclerosis. Mild coronary vascular calcification. Borderline cardiomegaly. No sizable pericardial effusion Mediastinum/Nodes: Patent trachea. No suspicious thyroid  mass. Mild mediastinal adenopathy. Right upper paratracheal nodes measuring up to 13 mm. Lower paratracheal nodes measuring up to 15 mm. Subcarinal lymph node measures 20 mm. Small bilateral hilar nodes measuring up to 10 mm on the right and 10 mm on the left. Esophagus within normal limits. Lungs/Pleura: Moderate bilateral pleural effusions. Mild smooth septal thickening. Partial lower lobe consolidations. Bilateral bronchial wall thickening. Upper Abdomen: No acute finding. Borderline to mild splenomegaly, spleen measuring 14.1 cm. Small volume ascites within the abdomen. Musculoskeletal: No acute osseous abnormality. Review of the MIP images confirms the above findings. IMPRESSION: 1. Negative for acute pulmonary embolus. 2. Borderline cardiomegaly with moderate pleural effusions and diffuse mild smooth septal thickening suggestive of edema 3. Bilateral bronchial wall thickening consistent with airways inflammatory process. Partial lower lobe consolidations could reflect atelectasis or pneumonia 4. Mild mediastinal and hilar adenopathy, indeterminate for reactive versus neoplastic process. Consider short interval CT follow-up in 3 months to reassess. 5. Borderline to mild splenomegaly. Small volume ascites within the abdomen. 6. Aortic atherosclerosis. Aortic Atherosclerosis (ICD10-I70.0).  Electronically Signed   By: Luke Bun M.D.   On: 10/21/2023 21:17   DG Chest Portable 1 View Result Date: 10/21/2023 CLINICAL DATA:  Chest pain and shortness of breath for over 2 weeks. EXAM: PORTABLE CHEST 1 VIEW COMPARISON:  Portable chest 03/08/2023. FINDINGS: There are small bilateral pleural effusions on the right-greater-than-left. In the right base there is overlying patchy atelectasis or consolidation. The remaining lungs are clear. The cardiomediastinal silhouette and vascular pattern are normal. There is calcification in the transverse aorta. Thoracic cage is intact. Overlying monitor wires. IMPRESSION: 1. Small bilateral pleural effusions, right-greater-than-left. 2. Patchy atelectasis or consolidation in the right base. 3. Aortic atherosclerosis. Electronically Signed   By: Francis Quam M.D.   On: 10/21/2023 20:05    Microbiology: Results for orders placed or performed during the hospital encounter of 10/22/23  Resp panel by RT-PCR (RSV, Flu A&B, Covid) Anterior Nasal Swab     Status: None   Collection Time: 10/23/23  4:02 AM   Specimen: Anterior Nasal Swab  Result Value Ref Range Status   SARS Coronavirus 2 by RT PCR NEGATIVE NEGATIVE Final    Comment: (NOTE) SARS-CoV-2 target nucleic acids are NOT DETECTED.  The SARS-CoV-2 RNA is generally detectable in upper respiratory specimens during the acute phase of infection. The lowest concentration of SARS-CoV-2 viral copies this assay can detect is 138 copies/mL. A negative result does not preclude SARS-Cov-2 infection and  should not be used as the sole basis for treatment or other patient management decisions. A negative result may occur with  improper specimen collection/handling, submission of specimen other than nasopharyngeal swab, presence of viral mutation(s) within the areas targeted by this assay, and inadequate number of viral copies(<138 copies/mL). A negative result must be combined with clinical observations,  patient history, and epidemiological information. The expected result is Negative.  Fact Sheet for Patients:  BloggerCourse.com  Fact Sheet for Healthcare Providers:  SeriousBroker.it  This test is no t yet approved or cleared by the [place]  FDA and  has been authorized for detection and/or diagnosis of SARS-CoV-2 by FDA under an Emergency Use Authorization (EUA). This EUA will remain  in effect (meaning this test can be used) for the duration of the COVID-19 declaration under Section 564(b)(1) of the Act, 21 U.S.C.section 360bbb-3(b)(1), unless the authorization is terminated  or revoked sooner.       Influenza A by PCR NEGATIVE NEGATIVE Final   Influenza B by PCR NEGATIVE NEGATIVE Final    Comment: (NOTE) The Xpert Xpress SARS-CoV-2/FLU/RSV plus assay is intended as an aid in the diagnosis of influenza from Nasopharyngeal swab specimens and should not be used as a sole basis for treatment. Nasal washings and aspirates are unacceptable for Xpert Xpress SARS-CoV-2/FLU/RSV testing.  Fact Sheet for Patients: BloggerCourse.com  Fact Sheet for Healthcare Providers: SeriousBroker.it  This test is not yet approved or cleared by the [place]  FDA and has been authorized for detection and/or diagnosis of SARS-CoV-2 by FDA under an Emergency Use Authorization (EUA). This EUA will remain in effect (meaning this test can be used) for the duration of the COVID-19 declaration under Section 564(b)(1) of the Act, 21 U.S.C. section 360bbb-3(b)(1), unless the authorization is terminated or revoked.     Resp Syncytial Virus by PCR NEGATIVE NEGATIVE Final    Comment: (NOTE) Fact Sheet for Patients: BloggerCourse.com  Fact Sheet for Healthcare Providers: SeriousBroker.it  This test is not yet approved or cleared by the [place] FDA and has been authorized for detection and/or diagnosis of SARS-CoV-2 by FDA under an Emergency Use Authorization (EUA). This EUA will remain in effect (meaning this test can be used) for the duration of the COVID-19 declaration under Section 564(b)(1) of the Act, 21 U.S.C. section 360bbb-3(b)(1), unless the authorization is terminated or revoked.  Performed at [facility], [address] [place], [place] [address]   Blood Culture (routine x 2)     Status: None (Preliminary result)   Collection Time: 10/23/23  4:02 AM   Specimen: BLOOD  Result Value Ref Range Status   Specimen Description BLOOD BLOOD LEFT ARM  Final   Special Requests   Final    BOTTLES DRAWN AEROBIC AND ANAEROBIC Blood Culture adequate volume   Culture   Final    NO GROWTH 3 DAYS Performed at [facility], [address] [place], [place] [address]    Report Status PENDING  Incomplete  Blood Culture (routine x 2)     Status: None (Preliminary result)   Collection Time: 10/23/23  4:02 AM   Specimen: BLOOD  Result Value Ref Range Status   Specimen Description BLOOD BLOOD RIGHT ARM  Final   Special Requests   Final    BOTTLES DRAWN AEROBIC AND ANAEROBIC Blood Culture adequate volume   Culture   Final    NO GROWTH 3 DAYS Performed at [facility], [address] [place], [place] [address]  Report Status PENDING  Incomplete    Labs: CBC: Recent Labs  Lab 10/21/23 1931 10/22/23 2027 10/23/23 1102 10/24/23 0356 10/27/23 0543  WBC 9.6 12.2* 7.0 6.6 8.5  NEUTROABS 7.3  --   --   --   --   HGB 12.1 13.0 10.4* 10.5* 12.1  HCT 39.5 41.5 35.1* 35.0* 39.4  MCV 78.2* 77.9* 80.1 79.7* 78.6*  PLT 291 316 265 257 308   Basic Metabolic Panel: Recent Labs  Lab 10/21/23 1931 10/22/23 2027 10/23/23 1102 10/24/23 0356 10/25/23 0531 10/27/23 0543  NA 137 137  --  138 140 140  K 3.5 3.9  --  3.6 3.8 3.9  CL 106 103  --  107 104 101  CO2 24 23  --  26 27 28    GLUCOSE 193* 136*  --  108* 126* 106*  BUN 12 10  --  11 15 19   CREATININE 0.50 0.47 0.56 0.33* 0.47 0.66  CALCIUM  8.7* 8.7*  --  8.4* 8.7* 9.4  MG  --   --   --  1.9 1.7 2.0   Liver Function Tests: Recent Labs  Lab 10/21/23 1931 10/22/23 2027 10/24/23 0356  AST 21 17 16   ALT 23 19 14   ALKPHOS 102 103 86  BILITOT 0.7 1.1 0.5  PROT 6.4* 6.8 6.0*  ALBUMIN 3.2* 3.4* 3.0*   CBG: No results for input(s): GLUCAP in the last 168 hours.  Discharge time spent: 35 minutes  Signed: Murvin Mana, MD [facility] 10/27/2023

## 2023-10-27 NOTE — Plan of Care (Signed)

## 2023-10-28 LAB — CULTURE, BLOOD (ROUTINE X 2)
Culture: NO GROWTH
Culture: NO GROWTH
Special Requests: ADEQUATE
Special Requests: ADEQUATE

## 2023-11-30 NOTE — Progress Notes (Deleted)
  Cardiology Office Note   Date:  11/30/2023  ID:  Kendra Clements, DOB [birth year], MRN [id number] PCP: Pcp, No  [facility] Providers Cardiologist:  None { Click to update primary MD,subspecialty MD or APP then REFRESH:1}    History of Present Illness Kendra Clements is a 43 y.o. female PMH *** who presents for ***.  Patient was admitted on 10/22/2023 with acute hypoxemic respiratory failure.  She was treated with antibiotics for possible pneumonia and also found to have reduced ejection fraction 35 to 40%.  She was treated with diuresis and put on GDMT.  She had an MRI which did not show any LGE and was suggestive of a nonischemic etiology.  She saw the [facility] group in clinic on 11/10/2023.  Relevant CVD History -RHC/LHC and coronary angiogram scheduled with [facility] 12/08/2023 -Cardiac MRI 10/26/2023 LVEF 37% with normal RV size and function and no LGE or scar overall suggestive of NICM -TTE 10/25/2023 LVEF 35 to 40% with global hypokinesis, normal RV function, mild to moderate MR   ROS: Pt denies any chest discomfort, jaw pain, arm pain, palpitations, syncope, presyncope, orthopnea, PND, or LE edema.  Studies Reviewed I have independently reviewed the patient's ECG, ***.  Physical Exam VS:  There were no vitals taken for this visit.       Wt Readings from Last 3 Encounters:  10/22/23 220 lb (99.8 kg)  10/21/23 220 lb (99.8 kg)  08/04/23 220 lb (99.8 kg)    GEN: No acute distress. NECK: No JVD; No carotid bruits. CARDIAC: ***RRR, no murmurs, rubs, gallops. RESPIRATORY:  Clear to auscultation. EXTREMITIES:  Warm and well-perfused. No edema.  ASSESSMENT AND PLAN NICM ***        {Are you ordering a CV Procedure (e.g. stress test, cath, DCCV, TEE, etc)?   Press F2        :[id number]}  Dispo: ***  Signed, Caron Poser, MD

## 2023-12-01 ENCOUNTER — Ambulatory Visit

## 2023-12-08 DIAGNOSIS — R079 Chest pain, unspecified: Secondary | ICD-10-CM

## 2023-12-08 DIAGNOSIS — I5021 Acute systolic (congestive) heart failure: Secondary | ICD-10-CM

## 2023-12-16 ENCOUNTER — Ambulatory Visit: Admitting: Cardiology

## 2023-12-16 NOTE — Progress Notes (Deleted)
  Cardiology Office Note   Date:  12/16/2023  ID:  Kendra, Clements [birth year], MRN [id number] PCP: Pcp, No  [facility] Providers Cardiologist:  None { Click to update primary MD,subspecialty MD or APP then REFRESH:1}    History of Present Illness Kendra Clements is a 43 y.o. female ***  ROS: ***  Studies Reviewed      *** Risk Assessment/Calculations {Does this patient have ATRIAL FIBRILLATION?:[phone]} No BP recorded.  {Refresh Note OR Click here to enter BP  :1}***       Physical Exam VS:  There were no vitals taken for this visit.       Wt Readings from Last 3 Encounters:  10/22/23 220 lb (99.8 kg)  10/21/23 220 lb (99.8 kg)  08/04/23 220 lb (99.8 kg)    GEN: Well nourished, well developed in no acute distress NECK: No JVD; No carotid bruits CARDIAC: ***RRR, no murmurs, rubs, gallops RESPIRATORY:  Clear to auscultation without rales, wheezing or rhonchi  ABDOMEN: Soft, non-tender, non-distended EXTREMITIES:  No edema; No deformity   ASSESSMENT AND PLAN ***    {Are you ordering a CV Procedure (e.g. stress test, cath, DCCV, TEE, etc)?   Press F2        :[id number]}  Dispo: ***  Signed, Simuel Stebner, NP

## 2023-12-22 ENCOUNTER — Encounter
Admission: RE | Disposition: A | Discharge status: Home / Self Care | Payer: Self-pay | Source: Home / Self Care | Attending: Cardiology

## 2023-12-22 ENCOUNTER — Encounter: Payer: Self-pay | Admitting: Cardiology

## 2023-12-22 ENCOUNTER — Ambulatory Visit
Admission: RE | Admit: 2023-12-22 | Discharge: 2023-12-22 | Disposition: A | Discharge status: Home / Self Care | Attending: Cardiology | Admitting: Cardiology

## 2023-12-22 DIAGNOSIS — R079 Chest pain, unspecified: Secondary | ICD-10-CM | POA: Diagnosis present

## 2023-12-22 DIAGNOSIS — I5020 Unspecified systolic (congestive) heart failure: Secondary | ICD-10-CM | POA: Diagnosis not present

## 2023-12-22 DIAGNOSIS — I272 Pulmonary hypertension, unspecified: Secondary | ICD-10-CM | POA: Insufficient documentation

## 2023-12-22 DIAGNOSIS — Z79899 Other long term (current) drug therapy: Secondary | ICD-10-CM | POA: Insufficient documentation

## 2023-12-22 DIAGNOSIS — I110 Hypertensive heart disease with heart failure: Secondary | ICD-10-CM | POA: Insufficient documentation

## 2023-12-22 DIAGNOSIS — F17210 Nicotine dependence, cigarettes, uncomplicated: Secondary | ICD-10-CM | POA: Diagnosis not present

## 2023-12-22 DIAGNOSIS — R0602 Shortness of breath: Secondary | ICD-10-CM | POA: Diagnosis not present

## 2023-12-22 DIAGNOSIS — I5021 Acute systolic (congestive) heart failure: Secondary | ICD-10-CM

## 2023-12-22 HISTORY — PX: RIGHT/LEFT HEART CATH AND CORONARY ANGIOGRAPHY: CATH118266

## 2023-12-22 HISTORY — DX: Essential (primary) hypertension: I10

## 2023-12-22 LAB — POCT I-STAT 7, (LYTES, BLD GAS, ICA,H+H)
Acid-base deficit: 4 mmol/L — ABNORMAL HIGH (ref 0.0–2.0)
Bicarbonate: 21.3 mmol/L (ref 20.0–28.0)
Calcium, Ion: 1.15 mmol/L (ref 1.15–1.40)
HCT: 29 % — ABNORMAL LOW (ref 36.0–46.0)
Hemoglobin: 9.9 g/dL — ABNORMAL LOW (ref 12.0–15.0)
O2 Saturation: 95 %
Potassium: 3.3 mmol/L — ABNORMAL LOW (ref 3.5–5.1)
Sodium: 140 mmol/L (ref 135–145)
TCO2: 22 mmol/L (ref 22–32)
pCO2 arterial: 36.8 mmHg (ref 32–48)
pH, Arterial: 7.369 (ref 7.35–7.45)
pO2, Arterial: 80 mmHg — ABNORMAL LOW (ref 83–108)

## 2023-12-22 LAB — POCT I-STAT EG7
Acid-base deficit: 2 mmol/L (ref 0.0–2.0)
Bicarbonate: 23.4 mmol/L (ref 20.0–28.0)
Calcium, Ion: 1.23 mmol/L (ref 1.15–1.40)
HCT: 30 % — ABNORMAL LOW (ref 36.0–46.0)
Hemoglobin: 10.2 g/dL — ABNORMAL LOW (ref 12.0–15.0)
O2 Saturation: 73 %
Potassium: 3.4 mmol/L — ABNORMAL LOW (ref 3.5–5.1)
Sodium: 137 mmol/L (ref 135–145)
TCO2: 25 mmol/L (ref 22–32)
pCO2, Ven: 42.7 mmHg — ABNORMAL LOW (ref 44–60)
pH, Ven: 7.346 (ref 7.25–7.43)
pO2, Ven: 40 mmHg (ref 32–45)

## 2023-12-22 SURGERY — RIGHT/LEFT HEART CATH AND CORONARY ANGIOGRAPHY
Anesthesia: Moderate Sedation | Laterality: Bilateral

## 2023-12-22 MED ORDER — FENTANYL CITRATE (PF) 100 MCG/2ML IJ SOLN
INTRAMUSCULAR | Status: AC
  Filled 2023-12-22: qty 2

## 2023-12-22 MED ORDER — SODIUM CHLORIDE 0.9% FLUSH: 3.0000 mL | INTRAVENOUS | Status: DC | PRN

## 2023-12-22 MED ORDER — SODIUM CHLORIDE 0.9 % IV SOLN: 250.0000 mL | INTRAVENOUS | Status: DC | PRN

## 2023-12-22 MED ORDER — ONDANSETRON HCL 4 MG/2ML IJ SOLN: 4.0000 mg | Freq: Four times a day (QID) | INTRAMUSCULAR | Status: DC | PRN

## 2023-12-22 MED ORDER — METOPROLOL TARTRATE 5 MG/5ML IV SOLN
INTRAVENOUS | Status: DC | PRN
  Administered 2023-12-22: 5 mg via INTRAVENOUS

## 2023-12-22 MED ORDER — SODIUM CHLORIDE 0.9% FLUSH: 3.0000 mL | Freq: Two times a day (BID) | INTRAVENOUS | Status: DC

## 2023-12-22 MED ORDER — IOHEXOL 300 MG/ML  SOLN
INTRAMUSCULAR | Status: DC | PRN
  Administered 2023-12-22: 96 mL

## 2023-12-22 MED ORDER — METOPROLOL TARTRATE 5 MG/5ML IV SOLN
INTRAVENOUS | Status: AC
  Filled 2023-12-22: qty 5

## 2023-12-22 MED ORDER — HEPARIN SODIUM (PORCINE) 1000 UNIT/ML IJ SOLN
INTRAMUSCULAR | Status: AC
  Filled 2023-12-22: qty 10

## 2023-12-22 MED ORDER — FREE WATER
500.0000 mL | Freq: Once | Status: AC
  Administered 2023-12-22: 500 mL via ORAL

## 2023-12-22 MED ORDER — FREE WATER
250.0000 mL | Freq: Once | Status: AC
  Administered 2023-12-22: 250 mL via ORAL

## 2023-12-22 MED ORDER — ASPIRIN 81 MG PO CHEW
81.0000 mg | CHEWABLE_TABLET | ORAL | Status: AC
  Administered 2023-12-22: 81 mg via ORAL

## 2023-12-22 MED ORDER — LABETALOL HCL 5 MG/ML IV SOLN: 10.0000 mg | INTRAVENOUS | Status: DC | PRN

## 2023-12-22 MED ORDER — HEPARIN (PORCINE) IN NACL 1000-0.9 UT/500ML-% IV SOLN
INTRAVENOUS | Status: DC | PRN
  Administered 2023-12-22: 1000 mL

## 2023-12-22 MED ORDER — ACETAMINOPHEN 325 MG PO TABS: 650.0000 mg | ORAL_TABLET | ORAL | Status: DC | PRN

## 2023-12-22 MED ORDER — HEPARIN (PORCINE) IN NACL 1000-0.9 UT/500ML-% IV SOLN
INTRAVENOUS | Status: AC
  Filled 2023-12-22: qty 1000

## 2023-12-22 MED ORDER — VERAPAMIL HCL 2.5 MG/ML IV SOLN
INTRAVENOUS | Status: AC
  Filled 2023-12-22: qty 2

## 2023-12-22 MED ORDER — LIDOCAINE HCL (PF) 1 % IJ SOLN
INTRAMUSCULAR | Status: DC | PRN
  Administered 2023-12-22 (×2): 2 mL

## 2023-12-22 MED ORDER — SODIUM CHLORIDE 0.9 % IV SOLN
250.0000 mL | INTRAVENOUS | Status: DC | PRN
  Administered 2023-12-22: 250 mL via INTRAVENOUS

## 2023-12-22 MED ORDER — VERAPAMIL HCL 2.5 MG/ML IV SOLN
INTRAVENOUS | Status: DC | PRN
  Administered 2023-12-22: 2.5 mg via INTRA_ARTERIAL

## 2023-12-22 MED ORDER — HEPARIN SODIUM (PORCINE) 1000 UNIT/ML IJ SOLN
INTRAMUSCULAR | Status: DC | PRN
  Administered 2023-12-22: 4500 [IU] via INTRAVENOUS

## 2023-12-22 MED ORDER — ASPIRIN 81 MG PO CHEW
CHEWABLE_TABLET | ORAL | Status: AC
  Filled 2023-12-22: qty 1

## 2023-12-22 MED ORDER — LIDOCAINE HCL 1 % IJ SOLN
INTRAMUSCULAR | Status: AC
  Filled 2023-12-22: qty 20

## 2023-12-22 MED ORDER — MIDAZOLAM HCL 2 MG/2ML IJ SOLN
INTRAMUSCULAR | Status: AC
  Filled 2023-12-22: qty 2

## 2023-12-22 MED ORDER — HYDRALAZINE HCL 20 MG/ML IJ SOLN: 10.0000 mg | INTRAMUSCULAR | Status: DC | PRN

## 2023-12-22 SURGICAL SUPPLY — 11 items
CATH 5FR JL3.5 JR4 ANG PIG MP (CATHETERS) IMPLANT
CATH BALLN WEDGE 5F 110CM (CATHETERS) IMPLANT
DEVICE RAD TR BAND REGULAR (VASCULAR PRODUCTS) IMPLANT
DRAPE BRACHIAL (DRAPES) IMPLANT
GLIDESHEATH SLEND SS 6F .021 (SHEATH) IMPLANT
GUIDEWIRE .025 260CM (WIRE) IMPLANT
GUIDEWIRE INQWIRE 1.5J.035X260 (WIRE) IMPLANT
PACK CARDIAC CATH (CUSTOM PROCEDURE TRAY) ×1 IMPLANT
SET ATX-X65L (MISCELLANEOUS) IMPLANT
SHEATH GLIDE SLENDER 4/5FR (SHEATH) IMPLANT
WIRE GUIDERIGHT .035X150 (WIRE) IMPLANT

## 2023-12-23 ENCOUNTER — Encounter: Payer: Self-pay | Admitting: Cardiology

## 2024-02-07 ENCOUNTER — Emergency Department
Admission: EM | Admit: 2024-02-07 | Discharge: 2024-02-07 | Disposition: A | Discharge status: Home / Self Care | Attending: Emergency Medicine | Admitting: Emergency Medicine

## 2024-02-07 ENCOUNTER — Other Ambulatory Visit: Payer: Self-pay

## 2024-02-07 ENCOUNTER — Emergency Department

## 2024-02-07 ENCOUNTER — Other Ambulatory Visit (HOSPITAL_COMMUNITY): Payer: Self-pay

## 2024-02-07 DIAGNOSIS — R0602 Shortness of breath: Secondary | ICD-10-CM | POA: Insufficient documentation

## 2024-02-07 DIAGNOSIS — J189 Pneumonia, unspecified organism: Secondary | ICD-10-CM

## 2024-02-07 DIAGNOSIS — I509 Heart failure, unspecified: Secondary | ICD-10-CM | POA: Diagnosis not present

## 2024-02-07 LAB — BASIC METABOLIC PANEL WITH GFR
Anion gap: 13 (ref 5–15)
BUN: 12 mg/dL (ref 6–20)
CO2: 25 mmol/L (ref 22–32)
Calcium: 9.2 mg/dL (ref 8.9–10.3)
Chloride: 103 mmol/L (ref 98–111)
Creatinine, Ser: 0.48 mg/dL (ref 0.44–1.00)
GFR, Estimated: >60 mL/min (ref >60)
Glucose, Bld: 166 mg/dL — ABNORMAL HIGH (ref 70–99)
Potassium: 3.2 mmol/L — ABNORMAL LOW (ref 3.5–5.1)
Sodium: 140 mmol/L (ref 135–145)

## 2024-02-07 LAB — CBC
HCT: 39.8 % (ref 36.0–46.0)
Hemoglobin: 12.1 g/dL (ref 12.0–15.0)
MCH: 22.8 pg — ABNORMAL LOW (ref 26.0–34.0)
MCHC: 30.4 g/dL (ref 30.0–36.0)
MCV: 75 fL — ABNORMAL LOW (ref 80.0–100.0)
Platelets: 280 K/uL (ref 150–400)
RBC: 5.31 MIL/uL — ABNORMAL HIGH (ref 3.87–5.11)
RDW: 15.9 % — ABNORMAL HIGH (ref 11.5–15.5)
WBC: 5.4 K/uL (ref 4.0–10.5)
nRBC: 0 % (ref 0.0–0.2)

## 2024-02-07 LAB — TROPONIN T, HIGH SENSITIVITY
Troponin T High Sensitivity: <15 ng/L (ref 0–19)
Troponin T High Sensitivity: <15 ng/L (ref 0–19)

## 2024-02-07 LAB — PRO BRAIN NATRIURETIC PEPTIDE: Pro Brain Natriuretic Peptide: 381 pg/mL — ABNORMAL HIGH (ref <300.0)

## 2024-02-07 MED ORDER — AMOXICILLIN-POT CLAVULANATE 875-125 MG PO TABS
1.0000 | ORAL_TABLET | Freq: Once | ORAL | Status: AC
  Administered 2024-02-07: 1 via ORAL
  Filled 2024-02-07: qty 1

## 2024-02-07 MED ORDER — IOHEXOL 350 MG/ML SOLN
75.0000 mL | Freq: Once | INTRAVENOUS | Status: AC | PRN
  Administered 2024-02-07: 75 mL via INTRAVENOUS

## 2024-02-07 MED ORDER — AMOXICILLIN-POT CLAVULANATE 875-125 MG PO TABS: 1.0000 | ORAL_TABLET | Freq: Two times a day (BID) | ORAL | 0 refills | Status: AC

## 2024-02-07 MED ORDER — POTASSIUM CHLORIDE CRYS ER 20 MEQ PO TBCR
40.0000 meq | EXTENDED_RELEASE_TABLET | Freq: Once | ORAL | Status: AC
  Administered 2024-02-07: 40 meq via ORAL
  Filled 2024-02-07: qty 2

## 2024-02-07 MED ORDER — FUROSEMIDE 10 MG/ML IJ SOLN
60.0000 mg | Freq: Once | INTRAMUSCULAR | Status: AC
  Administered 2024-02-07: 60 mg via INTRAVENOUS
  Filled 2024-02-07: qty 8

## 2024-02-07 MED ORDER — FUROSEMIDE 40 MG PO TABS: 40.0000 mg | ORAL_TABLET | Freq: Every day | ORAL | 1 refills | Status: AC

## 2024-02-07 NOTE — ED Provider Notes (Signed)
 Patient was signed out to me awaiting ambulatory trial and reassessment for safe disposition.  When ambulating she still did feel symptomatic, but having appropriate oxygen saturation, CT imaging was added on for further assessment.  CT imaging fortunately without findings of a PE, although suboptimal contrast timing.  They do find some evidence of bronchial wall thickening which may be consistent with a infectious source but clinically I feel this is also unlikely and I also feel that symptoms are most likely consistent with a slight degree of fluid overload.  Regardless I think reasonable to have the patient discharged home, I will reach out to our social work team to see if they can help arrange for affordability of outpatient medications for the patient prior to anticipated discharge.  Awaiting consultation from social work and meds to beds pharmacy, patient would like to leave at this time, she is not in any acute distress and is able to ambulate appropriately.  Will have her discharge at this time.  Patient not wanting to wait until social work evaluates her, she would like to leave at this time.  She was discharged home.   Fernand Rossie HERO, MD 02/07/24 [phone]

## 2024-02-07 NOTE — ED Provider Notes (Signed)
 [facility] Provider Note    Event Date/Time   First MD Initiated Contact with Patient 02/07/24 0228     (approximate)   History   Shortness of Breath and Cough   HPI  Kendra Clements is a 43 y.o. female who presents to the emergency department today because of concerns for shortness of breath.  Symptoms have been present over the past couple of days.  They have been getting increasingly worse.  This has been accompanied by cough.  She feels like she has foam in her lungs.  She has not appreciated any swelling in her legs.  Does have history of CHF but states she has not had her meds for about 2 weeks due to financial issues. No fevers.    Physical Exam   Triage Vital Signs: ED Triage Vitals  Encounter Vitals Group     BP 02/07/24 0142 (!) 186/83     Girls Systolic BP Percentile --      Girls Diastolic BP Percentile --      Boys Systolic BP Percentile --      Boys Diastolic BP Percentile --      Pulse Rate 02/07/24 0142 65     Resp 02/07/24 0142 (!) 22     Temp 02/07/24 0142 97.6 F (36.4 C)     Temp Source 02/07/24 0142 Oral     SpO2 02/07/24 0142 98 %     Weight --      Height 02/07/24 0144 5' 3 (1.6 m)     Head Circumference --      Peak Flow --      Pain Score 02/07/24 0144 0     Pain Loc --      Pain Education --      Exclude from Growth Chart --     Most recent vital signs: Vitals:   02/07/24 0142  BP: (!) 186/83  Pulse: 65  Resp: (!) 22  Temp: 97.6 F (36.4 C)  SpO2: 98%   General: Awake, alert, oriented. CV:  Good peripheral perfusion. Regular rate and rhythm. Resp:  Normal effort. Lungs clear. Abd:  No distention.    ED Results / Procedures / Treatments   Labs (all labs ordered are listed, but only abnormal results are displayed) Labs Reviewed  BASIC METABOLIC PANEL WITH GFR - Abnormal; Notable for the following components:      Result Value   Potassium 3.2 (*)    Glucose, Bld 166 (*)    All other components  within normal limits  CBC - Abnormal; Notable for the following components:   RBC 5.31 (*)    MCV 75.0 (*)    MCH 22.8 (*)    RDW 15.9 (*)    All other components within normal limits  PRO BRAIN NATRIURETIC PEPTIDE - Abnormal; Notable for the following components:   Pro Brain Natriuretic Peptide 381.0 (*)    All other components within normal limits  POC URINE PREG, ED  TROPONIN T, HIGH SENSITIVITY  TROPONIN T, HIGH SENSITIVITY     EKG  I, Guadalupe Eagles, attending physician, personally viewed and interpreted this EKG  EKG Time: 0146 Rate: 108 Rhythm: sinus tachycardia with PVC Axis: no st elevation Intervals: qtc 474 QRS: LVH ST changes: no st elevation Impression: abnormal ekg  RADIOLOGY I independently interpreted and visualized the CXR. My interpretation: No pneumonia Radiology interpretation:  IMPRESSION:  1. Resolved right lower lung airspace disease. No acute abnormality noted.  PROCEDURES:  Critical Care performed: No   MEDICATIONS ORDERED IN ED: Medications - No data to display   IMPRESSION / MDM / ASSESSMENT AND PLAN / ED COURSE  I reviewed the triage vital signs and the nursing notes.                              Differential diagnosis includes, but is not limited to, pneumonia, CHF, ACS, viral illness  Patient's presentation is most consistent with acute presentation with potential threat to life or bodily function.   Patient presented to the emergency department today because of concerns for shortness of breath and chest discomfort.  On exam patient's lungs are clear.  Blood work shows elevated BNP.  She was given IV Lasix .  She did then use the restroom multiple times.  No troponin elevation.  At this time I think likely slight CHF exacerbation secondary to medication noncompliance.  Will plan on having the nurse ambulate the patient.  If patient is feeling better and no desaturation do think be reasonable for discharge.     FINAL  CLINICAL IMPRESSION(S) / ED DIAGNOSES   Final diagnoses:  Shortness of breath       Note:  This document was prepared using Dragon voice recognition software and may include unintentional dictation errors.    Floy Roberts, MD 02/07/24 [phone]

## 2024-02-07 NOTE — ED Notes (Signed)
 Pulse oximetry 99% while ambulating

## 2024-02-07 NOTE — Discharge Instructions (Signed)
 You were seen today due to concern of shortness of breath.  We suspect that your symptoms may have been from fluid developing in your lungs, likely from the missed doses of your diuretic medications.  We have rewritten for the medications for you, I have also written for an antibiotic for you out of an abundance of precaution, please take this as instructed.  If you do have any worsening of symptoms please return to the emergency department immediately for further medical management.

## 2024-02-07 NOTE — TOC Initial Note (Signed)
 Transition of Care [facility]) - Initial/Assessment Note    Patient Details  Name: Kendra Clements MRN: [id number] Date of Birth: July 08, 1980  [facility]) CM/SW Contact:    Lavene Penagos L Arafat Cocuzza, LCSW Phone Number: 02/07/2024, 9:44 AM  Clinical Narrative:                  [facility] consult received for medication assistance. Chart reviewed. CSW spoke with attending and RN who advised that patient self reported that all of her medications are $3 to $4 at [facility]. Patient also has a smoking habit and she has the financial resources to support her habit.   Discharge Summary was entered.        Patient Goals and CMS Choice            Expected Discharge Plan and Services                                              Prior Living Arrangements/Services                       Activities of Daily Living      Permission Sought/Granted                  Emotional Assessment              Admission diagnosis:  short of breath Patient Active Problem List   Diagnosis Date Noted   Acute systolic (congestive) heart failure (HCC) 10/26/2023   Sepsis due to undetermined organism (HCC) 10/23/2023   Community acquired bilateral lower lobe pneumonia 10/23/2023   Gastroenteritis 10/23/2023   Class 2 obesity 10/23/2023   Abdominal pain 03/09/2023   Elevated glucose 03/09/2023   Abnormal urinalysis 03/09/2023   PCP:  Pcp, No Pharmacy:   [id number] [address], [place] - [address] AT [facility] OF [address] [place] [address] Phone: [phone] Fax: [phone]  [facility] [address] [place] [address] Phone: [phone] Fax: [phone]     Social Drivers of Health (SDOH) Social History: SDOH Screenings   Food Insecurity: No Food Insecurity (10/23/2023)  Housing: Low Risk  (10/23/2023)  Transportation Needs: No Transportation Needs  (10/23/2023)  Utilities: Not At Risk (10/23/2023)  Social Connections: Socially Isolated (10/23/2023)  Tobacco Use: High Risk (02/07/2024)   SDOH Interventions:     Readmission Risk Interventions     No data to display

## 2024-02-07 NOTE — ED Triage Notes (Addendum)
 Pt to ed from home via POV for cough and SOB x 4 days. Pt states I feel like I can't get a full deep  breath. Pt has been coughing up mucus. Pt is caox4, in no acute distress and ambulatory to triage. Pt was seen 10/7 and diagnosed with new onset CHF and has been non-compliant with her new meds due to money.
